# Patient Record
Sex: Male | Born: 1990 | Race: Black or African American | Hispanic: No | Marital: Single | Smoking: Former smoker
Health system: Southern US, Community
[De-identification: ages and names within clinical notes are randomized; demographics above are authoritative.]

## PROBLEM LIST (undated history)

## (undated) DIAGNOSIS — F458 Other somatoform disorders: Secondary | ICD-10-CM

## (undated) DIAGNOSIS — E860 Dehydration: Secondary | ICD-10-CM

## (undated) DIAGNOSIS — R21 Rash and other nonspecific skin eruption: Secondary | ICD-10-CM

## (undated) DIAGNOSIS — L309 Dermatitis, unspecified: Secondary | ICD-10-CM

## (undated) DIAGNOSIS — M25511 Pain in right shoulder: Secondary | ICD-10-CM

## (undated) DIAGNOSIS — J45909 Unspecified asthma, uncomplicated: Secondary | ICD-10-CM

## (undated) DIAGNOSIS — N342 Other urethritis: Secondary | ICD-10-CM

## (undated) DIAGNOSIS — L732 Hidradenitis suppurativa: Secondary | ICD-10-CM

## (undated) DIAGNOSIS — A630 Anogenital (venereal) warts: Secondary | ICD-10-CM

## (undated) DIAGNOSIS — J40 Bronchitis, not specified as acute or chronic: Principal | ICD-10-CM

## (undated) DIAGNOSIS — S42309A Unspecified fracture of shaft of humerus, unspecified arm, initial encounter for closed fracture: Secondary | ICD-10-CM

## (undated) HISTORY — DX: Hidradenitis suppurativa: L73.2

## (undated) HISTORY — DX: Unspecified asthma, uncomplicated: J45.909

## (undated) HISTORY — DX: Other somatoform disorders: F45.8

## (undated) HISTORY — DX: Anogenital (venereal) warts: A63.0

## (undated) HISTORY — DX: Pain in right shoulder: M25.511

## (undated) HISTORY — DX: Unspecified fracture of shaft of humerus, unspecified arm, initial encounter for closed fracture: S42.309A

## (undated) HISTORY — DX: Rash and other nonspecific skin eruption: R21

## (undated) HISTORY — DX: Dehydration: E86.0

## (undated) HISTORY — DX: Other urethritis: N34.2

## (undated) HISTORY — DX: Bronchitis, not specified as acute or chronic: J40

## (undated) HISTORY — DX: Dermatitis, unspecified: L30.9

## (undated) HISTORY — PX: TREATMENT FISTULA ANAL: SUR1390

---
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
## 2009-10-02 ENCOUNTER — Ambulatory Visit: Payer: Self-pay | Admitting: Family Medicine

## 2009-10-02 DIAGNOSIS — N342 Other urethritis: Secondary | ICD-10-CM

## 2009-10-02 HISTORY — DX: Other urethritis: N34.2

## 2009-11-05 ENCOUNTER — Ambulatory Visit: Payer: Self-pay | Admitting: Family Medicine

## 2009-11-05 DIAGNOSIS — F458 Other somatoform disorders: Secondary | ICD-10-CM

## 2009-11-05 HISTORY — DX: Other somatoform disorders: F45.8

## 2009-12-17 ENCOUNTER — Ambulatory Visit: Payer: Self-pay | Admitting: Family Medicine

## 2009-12-21 LAB — CONVERTED CEMR LAB
Basophils Absolute: 0 10*3/uL (ref 0.0–0.1)
Chlamydia, Swab/Urine, PCR: NEGATIVE
Eosinophils Absolute: 0.1 10*3/uL (ref 0.0–0.7)
GC Probe Amp, Urine: NEGATIVE
Lymphs Abs: 2.3 10*3/uL (ref 0.7–4.0)
MCHC: 34.7 g/dL (ref 30.0–36.0)
MCV: 96.7 fL (ref 78.0–100.0)
Monocytes Absolute: 0.3 10*3/uL (ref 0.1–1.0)
Neutrophils Relative %: 39 % — ABNORMAL LOW (ref 43.0–77.0)
Platelets: 152 10*3/uL (ref 150.0–400.0)
RDW: 12.7 % (ref 11.5–14.6)
WBC: 4.5 10*3/uL (ref 4.5–10.5)

## 2010-01-25 ENCOUNTER — Emergency Department (HOSPITAL_COMMUNITY): Admission: EM | Admit: 2010-01-25 | Discharge: 2010-01-26 | Payer: Self-pay | Admitting: Emergency Medicine

## 2010-03-22 ENCOUNTER — Encounter: Payer: Self-pay | Admitting: Allergy and Immunology

## 2010-03-23 ENCOUNTER — Ambulatory Visit
Admission: RE | Admit: 2010-03-23 | Discharge: 2010-03-23 | Payer: Self-pay | Source: Home / Self Care | Attending: Family Medicine | Admitting: Family Medicine

## 2010-03-23 ENCOUNTER — Encounter: Payer: Self-pay | Admitting: Family Medicine

## 2010-03-23 DIAGNOSIS — L732 Hidradenitis suppurativa: Secondary | ICD-10-CM | POA: Insufficient documentation

## 2010-03-23 HISTORY — DX: Hidradenitis suppurativa: L73.2

## 2010-03-31 ENCOUNTER — Telehealth (INDEPENDENT_AMBULATORY_CARE_PROVIDER_SITE_OTHER): Payer: Self-pay | Admitting: *Deleted

## 2010-04-01 NOTE — Assessment & Plan Note (Signed)
Summary: has pimple on buttocks/dt   Vital Signs:  Patient profile:   20 year old male Height:      76 inches (193.04 cm) Weight:      212 pounds (96.36 kg) O2 Sat:      94 % on Room air Temp:     97.9 degrees F (36.61 degrees C) oral Pulse rate:   77 / minute BP sitting:   122 / 80  (right arm) Cuff size:   large  Vitals Entered By: Josph Macho RMA (March 23, 2010 3:18 PM)  O2 Flow:  Room air CC: Pimple on buttocks/ buttcrack/ CF Is Patient Diabetic? No   History of Present Illness: patient is a 20 year old African American male who is in today for evaluation of her recurrent follicular lesion on his gluteus maximus. He reports having trouble with this lesion recurring since November. He says it is hot, red and tender with an elevated center it then drains some pus and then improves only to recur again some weeks later in the same spot. He denies fevers, chills, malaise, myalgias, nausea, anorexia or signs of systemic illness. He reports this particular lesion is actually less uncomfortable today as he did open and drain pus already. He also notes persistent urethral discharge but he denies any color any odor, pain, burning or lesions associated with it he's had it evaluated multiple times and is always been negative over the last 6 months for any infection. He says the discharge is always the same intensity happened after a shower after a bowel movement or randomly. He is denying any other concerns today no shortness of breath, chest pain, palpitations, congestion, headache, GU complaints  Current Medications (verified): 1)  None  Allergies (verified): No Known Drug Allergies  Past History:  Past medical history reviewed for relevance to current acute and chronic problems. Social history (including risk factors) reviewed for relevance to current acute and chronic problems.  Social History: Reviewed history from 10/02/2009 and no changes required. 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 Single Current Smoker Alcohol use-yes, occasional Lives with parents  Review of Systems      See HPI  Physical Exam  General:  Well-developed,well-nourished,in no acute distress; alert,appropriate and cooperative throughout examination Head:  Normocephalic and atraumatic without obvious abnormalities. No apparent alopecia or balding. Mouth:  Oral mucosa and oropharynx without lesions or exudates.  Teeth in good repair. Lungs:  Normal respiratory effort, chest expands symmetrically. Lungs are clear to auscultation, no crackles or wheezes. Heart:  Normal rate and regular rhythm. S1 and S2 normal without gallop, murmur, click, rub or other extra sounds. Abdomen:  Bowel sounds positive,abdomen soft and non-tender without masses, organomegaly or hernias noted. Genitalia:  Testes bilaterally descended without nodularity, tenderness or masses. No scrotal masses or lesions. No penis lesions. He demonstrats some cloudy, scant fluid with manipulation Extremities:  No clubbing, cyanosis, edema, or deformity noted with normal full range of motion of all joints.   Skin:  1-2 cm slightly raised, erythematous lesion in gluteal cleft on right side, smallopening noted in center, very minimally tender Psych:  flat affect.     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 If it recurs he may require referral for surgical removal  Problem #  2:  OTHER URETHRITIS (ICD-597.89)  Orders: T-Chlamydia & GC Probe, Genital ([id number]) Patient reassured that discharge appears physiologic and unless he develops any further symptoms this a  normal variant  Complete Medication List: 1)  Bactrim Ds 800-160 Mg Tabs (Sulfamethoxazole-trimethoprim) .Marland Kitchen..  1 tab by mouth two times a day x 10 day 2)  Betasept Surgical Scrub 4 % Liqd (Chlorhexidine gluconate) .... Wash affected area daily  Patient Instructions: 1)  Please schedule a follow-up appointment as needed if symptoms worsen, if follicular lesion does not resolve then may need referral to surgery for further evaluation  2)  Take your antibiotic as prescribed until ALL of it is gone, but stop if you develop a rash or swelling and contact our office as soon as possible. Eat a yogurt daily while taking antibiotics Prescriptions: BETASEPT SURGICAL SCRUB 4 % LIQD (CHLORHEXIDINE GLUCONATE) wash affected area daily  #4 oz x 1   Entered and Authorized by:   Danise Edge MD   Signed by:   Danise Edge MD on 03/23/2010   Method used:   Print then Give to Patient   RxID:   [id number] BACTRIM DS 800-160 MG TABS (SULFAMETHOXAZOLE-TRIMETHOPRIM) 1 tab by mouth two times a day x 10 day  #20 x 0   Entered and Authorized by:   Danise Edge MD   Signed by:   Danise Edge MD on 03/23/2010   Method used:   Print then Give to Patient   RxID:   [id number]    Orders Added: 1)  T-Chlamydia & GC Probe, Genital [[id number]] 2)  Est. Patient Level III [84696]

## 2010-04-01 NOTE — Assessment & Plan Note (Signed)
Summary: fup//ccm   Vital Signs:  Patient profile:   20 year old male Height:      76 inches (193.04 cm) Weight:      220 pounds (100.00 kg) O2 Sat:      98 % on Room air Temp:     98.3 degrees F (36.83 degrees C) oral Pulse rate:   75 / minute BP sitting:   138 / 84  (left arm) Cuff size:   regular  Vitals Entered By: Josph Macho RMA (November 05, 2009 11:04 AM)  O2 Flow:  Room air CC: Follow-up visit/ CF Is Patient Diabetic? No   History of Present Illness: Patient is a 20 year old American male who is in with complaints of urethritis. He just had trouble with gonococcal and nongonococcal recurrent urethra this in the past. Yesterday he found out that her partner has chlamydia. He acknowledges not using condoms. He has had some urethral discharge. He denies any burning any odor to the discharge any abdominal pain fevers chills or back pain. No other lesions are noted no recent viral or flulike illness noted no chest pain palpitations. His mood appears depressed at this visit he makes poor eye contact is anxious and ultimately says that he does not care about his health or his future. He denies being suicidal or homicidal at does not choose to take steps to improve his situation or his state of mind.  Current Medications (verified): 1)  Metronidazole 500 Mg Tabs (Metronidazole) .Marland Kitchen.. 1 Tab By Mouth Three Times A Day X 7days 2)  Fluconazole 150 Mg Tabs (Fluconazole) .Marland Kitchen.. 1 Tab By Mouth Q Wk X 2 Weeks  Allergies (verified): No Known Drug Allergies  Past History:  Past medical history reviewed for relevance to current acute and chronic problems. Social history (including risk factors) reviewed for relevance to current acute and chronic problems.  Social History: Reviewed history from 10/02/2009 and no changes required. 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 Single Current Smoker Alcohol use-yes, occasional Lives with  parents  Review of Systems      See HPI  Physical Exam  General:  Well-developed,well-nourished,in no acute distress; alert,appropriate and cooperative throughout examination Head:  Normocephalic and atraumatic without obvious abnormalities. No apparent alopecia or balding. Mouth:  Oral mucosa and oropharynx without lesions or exudates.  Teeth in good repair. Neck:  No deformities, masses, or tenderness noted. Lungs:  Normal respiratory effort, chest expands symmetrically. Lungs are clear to auscultation, no crackles or wheezes. Heart:  Normal rate and regular rhythm. S1 and S2 normal without gallop, murmur, click, rub or other extra sounds. Abdomen:  Bowel sounds positive,abdomen soft and non-tender without masses, organomegaly or hernias noted. Extremities:  No clubbing, cyanosis, edema, or deformity noted  Psych:  withdrawn, poor eye contact, and agitated.     Impression & Recommendations:  Problem # 1:  OTHER URETHRITIS (ICD-597.89) Treated empirically with Doxycycline and Amoxicillin today if his urethritis does not resolve he will return for further evaluation and he is asked to use protection when sexually active. He will return in roughly 6 weeks for CBC, HIV and RPR testing  Problem # 2:  ANHEDONIA (ICD-300.89) He is offered referral for counselling and/or medications and declines  Complete Medication List: 1)  Metronidazole 500 Mg Tabs (Metronidazole) .Marland Kitchen.. 1 tab by mouth three times a day x 7days 2)  Fluconazole 150 Mg Tabs (Fluconazole) .Marland Kitchen.. 1 tab by mouth q wk x 2 weeks 3)  Doxycycline  Hyclate 100 Mg Caps (Doxycycline hyclate) .Marland Kitchen.. 1 cap by mouth two times a day x 7days 4)  Amoxicillin 500 Mg Caps (Amoxicillin) .Marland Kitchen.. 1 cap by mouth three times a day 7 day  Patient Instructions: 1)  Please schedule a follow-up appointment as needed if symptoms worsen or do not improve 2)  Needs RPR, HIV and CBC in 6 weeks for 597.80 Prescriptions: AMOXICILLIN 500 MG CAPS (AMOXICILLIN) 1  cap by mouth three times a day 7 day  #21 x 0   Entered and Authorized by:   Danise Edge MD   Signed by:   Danise Edge MD on 11/05/2009   Method used:   Print then Give to Patient   RxID:   [id number] DOXYCYCLINE HYCLATE 100 MG CAPS (DOXYCYCLINE HYCLATE) 1 cap by mouth two times a day x 7days  #14 x 0   Entered and Authorized by:   Danise Edge MD   Signed by:   Danise Edge MD on 11/05/2009   Method used:   Print then Give to Patient   RxID:   [id number]

## 2010-04-01 NOTE — Assessment & Plan Note (Signed)
Summary: NGU // RS   Vital Signs:  Patient profile:   20 year old male Height:      76 inches (193.04 cm) Weight:      218 pounds (99.09 kg) BMI:     26.63 O2 Sat:      96 % on Room air Temp:     98.2 degrees F (36.78 degrees C) oral Pulse rate:   64 / minute BP sitting:   122 / 82  (left arm) Cuff size:   regular  Vitals Entered By: Josph Macho RMA (October 02, 2009 3:07 PM)  O2 Flow:  Room air CC: Nongonococcal Urethritis- was treated in Nov, March, May, June, and July/ pt went to [facility] this [facility] and they informed pt he didn't have this again but pt states he has discharge like the other times/ CF   History of Present Illness: Patient in today for evaluation of recurrent NGU. He has been presenting to the health department off and on for months with recurrence of penile discharge. He describes it as clear and non painful. They have treated him empirically with by mouth and Im antibiotics but all of his testing has come back neg for GC and Chlamydia. No fevers/chills/dysuria/lesions/anorexia/abdominal or back pain. He is concerned because it always seems to happen after he is sexually active with the same woman. She says she has been tested and is neg. Sometimes he reports he even uses condoms and he still gets discharge. 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 Single Current Smoker Alcohol use-yes, occasional Lives with parents Occupation:   employed Smoking Status:  current  Review of Systems  The patient denies anorexia, fever, weight loss, weight gain, vision loss, decreased hearing, hoarseness, chest pain, syncope, dyspnea on exertion, peripheral edema, prolonged cough, headaches, hemoptysis, abdominal pain, melena, hematochezia, severe indigestion/heartburn, hematuria, incontinence, genital sores, muscle weakness, suspicious skin lesions, transient blindness, difficulty walking, depression, unusual weight change, enlarged lymph nodes, and testicular masses.    Physical Exam  General:  Well-developed,well-nourished,in no acute distress; alert,appropriate and cooperative throughout examination Head:  Normocephalic and atraumatic without obvious abnormalities. No apparent alopecia or balding. Eyes:  No corneal or conjunctival inflammation noted. EOMI. Perrla.  Ears:  External ear exam shows no significant lesions or deformities.  Otoscopic examination reveals clear canals, tympanic membranes are intact bilaterally without bulging, retraction, inflammation or discharge. Hearing is grossly normal bilaterally. Nose:  External nasal examination shows no deformity or inflammation. Nasal mucosa are pink and moist without lesions or exudates. Mouth:  Oral mucosa and oropharynx without lesions or exudates.  Teeth in good repair. Neck:  No deformities, masses, or tenderness noted. Lungs:  Normal respiratory effort, chest expands symmetrically. Lungs are clear to auscultation, no crackles or wheezes. Heart:  Normal rate and regular rhythm. S1 and S2 normal without gallop, murmur, click, rub or other extra sounds. Abdomen:  Bowel sounds positive,abdomen soft and non-tender without masses, organomegaly or hernias noted. Msk:  No deformity or scoliosis noted of  thoracic or lumbar spine.   Pulses:  R and L carotid,radial,femoral,dorsalis pedis and posterior tibial pulses are full and equal bilaterally Extremities:  No clubbing, cyanosis,  edema, or deformity noted with normal full range of motion of all joints.   Neurologic:  No cranial nerve deficits noted. Station and gait are normal. Plantar reflexes are down-going bilaterally. DTRs are symmetrical throughout. Sensory, motor and coordinative functions appear intact. Skin:  Intact without suspicious lesions or rashes Cervical Nodes:  No lymphadenopathy noted Inguinal Nodes:  No significant adenopathy Psych:  Cognition and judgment appear intact. Alert and cooperative with normal attention span and concentration. No apparent delusions, illusions, hallucinations   Impression & Recommendations:  Problem # 1:  OTHER URETHRITIS (ICD-597.89) Avoid perfumes and harsh soaps, start a probiotic. Treat with Flagyl and Diflucan simultaeously and use condoms with all sexual activity. Report persistent symptoms.  Problem # 2:  Preventive Health Care (ICD-V70.0) Check fasting labs prior to next visit  Complete Medication List: 1)  Metronidazole 500 Mg Tabs (Metronidazole) .Marland Kitchen.. 1 tab by mouth three times a day x 7days 2)  Fluconazole 150 Mg Tabs (Fluconazole) .Marland Kitchen.. 1 tab by mouth q wk x 2 weeks  Patient Instructions: 1)  Please schedule a follow-up appointment in 1 month.  2)  Please schedule a follow-up appointment as needed if symptoms worsen or do not resolve.  3)  Use a mild soap without any perfumes. 4)  Use cotton undergarments 5)  Take meds as directed  6)  If you could be exposed to sexually transmitted diseases. you should use a condom.  Prescriptions: FLUCONAZOLE 150 MG TABS (FLUCONAZOLE) 1 tab by mouth q wk x 2 weeks  #2 x 0   Entered and Authorized by:   Danise Edge MD   Signed by:   Danise Edge MD on 10/02/2009   Method used:   Print then Give to Patient   RxID:   [id number] METRONIDAZOLE 500 MG TABS (METRONIDAZOLE) 1 tab by mouth three times a day x 7days  #21 x 0   Entered and Authorized by:   Danise Edge MD   Signed by:   Danise Edge MD on 10/02/2009    Method used:   Print then Give to Patient   RxID:   [id number]

## 2010-04-07 NOTE — Progress Notes (Signed)
Summary: Chlamydia and Gonorrhea tests  Phone Note Outgoing Call   Summary of Call: Left a message for pt to return my call. Pt needs to be informed that Chlamydia and Gonorrhea testing came back negative.  Initial call taken by: Josph Macho RMA,  March 31, 2010 9:54 AM  Follow-up for Phone Call        Pt informed Follow-up by: Josph Macho RMA,  March 31, 2010 2:35 PM

## 2010-04-08 ENCOUNTER — Encounter: Payer: Self-pay | Admitting: Family Medicine

## 2010-06-04 ENCOUNTER — Ambulatory Visit: Payer: Self-pay | Admitting: Family Medicine

## 2010-06-22 ENCOUNTER — Other Ambulatory Visit: Payer: Self-pay | Admitting: Family Medicine

## 2010-06-29 ENCOUNTER — Ambulatory Visit (INDEPENDENT_AMBULATORY_CARE_PROVIDER_SITE_OTHER): Payer: Self-pay | Admitting: Family Medicine

## 2010-06-29 ENCOUNTER — Encounter: Payer: Self-pay | Admitting: Family Medicine

## 2010-06-29 VITALS — BP 134/83 | HR 69 | Temp 98.1°F | Ht 76.0 in | Wt 220.1 lb

## 2010-06-29 DIAGNOSIS — L732 Hidradenitis suppurativa: Secondary | ICD-10-CM

## 2010-06-29 DIAGNOSIS — N342 Other urethritis: Secondary | ICD-10-CM

## 2010-06-29 DIAGNOSIS — A630 Anogenital (venereal) warts: Secondary | ICD-10-CM

## 2010-06-29 DIAGNOSIS — F458 Other somatoform disorders: Secondary | ICD-10-CM

## 2010-06-29 HISTORY — DX: Anogenital (venereal) warts: A63.0

## 2010-06-29 MED ORDER — IMIQUIMOD 5 % EX CREA: TOPICAL_CREAM | CUTANEOUS | Status: DC

## 2010-06-29 NOTE — Assessment & Plan Note (Signed)
Grayish discharge on exam today, no obvious inflammation or irritation at meatus, check GC/Chlamydia before treating. Agrees to HIV and RPR testing today.

## 2010-06-29 NOTE — Assessment & Plan Note (Addendum)
Does have scattered lesions at base of glans. Will start Aldara cream to affected area 3 week and call if no improvement. Once again importance of regular condom is reaffirmed. Patient still not using them at all and noncommital about use

## 2010-06-29 NOTE — Patient Instructions (Addendum)
Urethritis, Adult Urethritis is an inflammation (soreness) of the urethra (the tube exiting from the bladder). It is often caused by germs that may be spread through sexual contact. TREATMENT Urethritis will usually respond to antibiotics. These are medications that kill germs. Take all the medicine given to you. You may feel better in a couple days, but TAKE ALL MEDICINE or the infection may not be completely cured and may become more difficult to treat. Response can generally be expected in 7 to 10 days. You may require additional treatment after more testing. IT IS VERY IMPORTANT THAT YOU  Not have sex until the test results are known and treatment is completed.   Know that you may be asked to notify your sex partner when your final test results are back.   Finish all medications as prescribed.   Prevent sexually transmitted infections including AIDS. Practice safe sex. Use condoms.  SEEK MEDICAL CARE IF:  Your symptoms are not improved in 2 to 3 days.   Your symptoms are getting worse.   Your develop abdominal (belly) pain.   You develop joint pain.   You have an oral temperature above 102 F (38.9 C).  SEEK IMMEDIATE MEDICAL CARE IF:  You have an oral temperature above 102 F (38.9 C), not controlled by medicine.   You develop severe pain in the belly, back or side.   You develop repeated vomiting.  TEST RESULTS Not all test results are available during your visit. If your test results are not back during the visit, make an appointment with your caregiver to find out the results. Do not assume everything is normal if you have not heard from your caregiver or the medical facility. It is important for you to follow-up on all of your test results. Document Released: 08/10/2000 Document Re-Released: 03/08/2009 [facility] Patient Information 2011 [place], [place].Genital Warts Genital warts are caused by a virus called Human Papilloma Virus (HPV). HPV is the most common sexually  transmitted disease, STD, and infection of the sex organs. There are about 100 different types of the HPV's and 4 of these can cause cancer of the cervix. Ninety percent of genital warts are caused by the HPV.  CAUSES This infection is spread by unprotected sex with an infected person. It can be spread by vaginal, anal, and oral sex. Many people will not know they are infected. They may be infected for years with little or no problems (symptoms). They can still unknowingly pass the infection to sexual partners. The most serious problem is that certain types of HPV is associated with cervical cancer but not genital warts. You are more likely to have HPV if you have other sexually transmitted disease. SYMPTOMS  Itching and irritation in the genital area.   Warts that bleed.   Painful sexual intercourse due to warts.  DIAGNOSIS  Warts are usually recognized with the naked eye in the vagina, on the vulva, the perineum, the anus and in the rectum.   With a Pap test.   With a biopsy.   With colposcopy (an instrument that magnifies the HPV cells that change color with certain solutions).  TREATMENT Warts can be removed by applying certain chemicals to the warts such as:  Podophllin.   Bichloroacedic acid.   Trichloroacedic acid.  Other treatments include:  Interferon injections.   Freezing (cryotherapy).   Laser treatment.   Removal of warts by burning them with electrified probe (electrocautery).   Surgery.  PREVENTION HPV vaccination can help prevent HPV infections  that cause genital warts and that cause cancer of the cervix. It is recommended that the vaccination be given to people between the ages 43 to 83 years old. The vaccine might not work as well or might not work at all if you already have HPV. It should not be given to pregnant women. HOME CARE INSTRUCTIONS  It is important to follow your caregiver's instructions. The warts will not go away without treatment. Repeat  treatments often are needed to get rid of the warts. Even after it appears that the warts are gone, the normal tissue underneath often remains infected.   DO NOT try to treat genital warts with medicine used to treat hand warts. This type of medicine is strong and can burn the skin in the genital area, causing more damage.   WARNING: This infection is contagious. Tell your sexual partner(s) with whom you had sex before treatment that you have genital warts. They may be infected also and need treatment.   WARNING: Avoid sexual contact while being treated. Following treatment the use of condoms will help prevent reinfection. This virus is hard to wipe out. You may always be contagious.   DO NOT touch or scratch the warts. This is a viral infection and may spread to other parts of your body (auto-inoculation).   Women with genital warts should have a cervical cancer check (pap smear) at least once a year. This type of cancer is slow-growing and can be cured if found early. Chances of developing cervical cancer are increased with HPV.   Inform your obstetrician in the event of pregnancy. This virus can be passed to the baby's respiratory tract. Discuss this with your caregiver.   Use a condom during sexual intercourse.   There are over-the-counter anti-itch creams you can use for itching, ask your caregiver before using it.   Ask your caregiver about HPV vaccination.   Have only one sex partner. Make sure your partner has only one sex partner.  SEEK MEDICAL CARE IF:  The treated skin becomes red, swollen, or painful.   An oral temperature above 104 or higher develops.   You feel generally ill.   You feel little lumps (pimple-like) in and around your genital area.   You are bleeding or have painful sexual intercourse.  MAKE SURE YOU:   Understand these instructions.   Will watch your condition.   Will get help right away if you are not doing well or get worse.  Document Released:  02/12/2000 Document Re-Released: 08/04/2009 [facility] Patient Information 2011 [place], [place].

## 2010-06-29 NOTE — Progress Notes (Signed)
Travis Burns [id number] January 30, 1991 06/29/2010      Progress Note-Follow Up  Subjective  Chief Complaint  Chief Complaint  Patient presents with  . Penile Discharge    and bumps (not sore) x 3 days    HPI  Patient is a 20 year old [place] American male in today with recurrent urethritis. He continues to be frequently sexually active with multiple partners and does not use condoms. He reports his discharge improved at his last visit. He was given a course of Bactrim for hidradenitis and he feels that helped his discharge. His hidradenitis also improved although he did recently have a flare requiring him to use his Hibiclens again which has now solved that problem. Unfortunately about 1-2 months ago he began to notice changes increase urethral discharge again. He denies color, odor, discomfort, dysuria, abdominal pain, back pain, fevers, chills, GI or GU complaints. He's not had any recent illness, chest pain or palpitations, shortness of breath. He does continue to show with anhedonia and fatigue but denies suicidal ideation. He is changing any products, soaps. Is complaining of some new lesions of raised papular, nontender on the glands and on the shaft for roughly 3 weeks now. They are unchanged since the appeared.  Past Medical History  Diagnosis Date  . Broken arm     left  . Genital warts 06/29/2010  . Other urethritis 10/02/2009  . HIDRADENITIS SUPPURATIVA 03/23/2010  . ANHEDONIA 11/05/2009    History reviewed. No pertinent past surgical history.  Family History  Problem Relation Age of Onset  . Cancer Maternal Grandfather     lung/ Smoker  . Alcohol abuse Paternal Grandmother     History   Social History  . Marital Status: Single    Spouse Name: N/A    Number of Children: N/A  . Years of Education: N/A   Occupational History  . Not on file.   Social History Main Topics  . Smoking status: Current Some Day Smoker    Types: Cigarettes  . Smokeless tobacco: Never Used  .  Alcohol Use: 0.0 oz/week    0 drink(s) per week     occasional  . Drug Use: Yes    Special: Marijuana     marijuana  . Sexually Active: Yes -- Male partner(s)   Other Topics Concern  . Not on file   Social History Narrative  . No narrative on file    Current Outpatient Prescriptions on File Prior to Visit  Medication Sig Dispense Refill  . HIBICLENS 4 % external liquid USE AS DIRECTED  236 mL  1  . DISCONTD: sulfamethoxazole-trimethoprim (BACTRIM DS) 800-160 MG per tablet Take 1 tablet by mouth 2 (two) times daily. X 10 days         No Known Allergies  Review of Systems  Review of Systems  Constitutional: Negative for fever and malaise/fatigue.  HENT: Negative for congestion.   Eyes: Negative for discharge.  Respiratory: Negative for shortness of breath.   Cardiovascular: Negative for chest pain, palpitations and leg swelling.  Gastrointestinal: Negative for nausea, abdominal pain and diarrhea.  Genitourinary: Negative for dysuria, urgency, frequency, hematuria and flank pain.       Notes a 1-2 month history of increased urethral discharge again, denies any pain or burning, no odor or color. Describes discharge as constant. Also notes some new asymptomatic bumps on his glans and shaft over last several weeks. Not enlarging, bleeding, itching or painful  Musculoskeletal: Negative for falls.  Skin: Negative for rash.  Neurological: Negative for loss of consciousness and headaches.  Endo/Heme/Allergies: Negative for polydipsia.  Psychiatric/Behavioral: Negative for depression and suicidal ideas. The patient is not nervous/anxious and does not have insomnia.     Objective  BP 134/83  Pulse 69  Temp(Src) 98.1 F (36.7 C) (Oral)  Ht 6\' 4"  (1.93 m)  Wt 220 lb 1.9 oz (99.846 kg)  BMI 26.79 kg/m2  SpO2 97%  Physical Exam  Physical Exam  Constitutional: He appears well-developed and well-nourished. No distress.  HENT:  Head: Normocephalic and atraumatic.  Eyes: No  scleral icterus.  Neck: Normal range of motion.  Cardiovascular: Normal rate, regular rhythm and normal heart sounds.   No murmur heard. Pulmonary/Chest: Effort normal and breath sounds normal. No respiratory distress.  Abdominal: Soft. Bowel sounds are normal. He exhibits no distension and no mass. There is no rebound.  Genitourinary: No penile tenderness.       Grayish discharge, small papular lesions one on glans and several at base of glans on shaft, mildly erythematous no inflammation. B/l inguinal LN palpated, Nontender roughly 1 cm in diameter b/l  Psychiatric:       Very flat affect    No results found for this basename: TSH   Lab Results  Component Value Date   WBC 4.5 12/17/2009   HGB 14.6 12/17/2009   HCT 41.9 12/17/2009   MCV 96.7 12/17/2009   PLT 152.0 12/17/2009   No results found for this basename: CREATININE, BUN, NA, K, CL, CO2   No results found for this basename: ALT, AST, GGT, ALKPHOS, BILITOT   No results found for this basename: CHOL   No results found for this basename: HDL   No results found for this basename: LDLCALC   No results found for this basename: TRIG   No results found for this basename: CHOLHDL     Assessment & Plan  OTHER URETHRITIS Grayish discharge on exam today, no obvious inflammation or irritation at meatus, check GC/Chlamydia before treating. Agrees to HIV and RPR testing today.   HIDRADENITIS SUPPURATIVA Had a recent flare but used the Hibicleans and it improved.  Genital warts Does have scattered lesions at base of glans. Will start Aldara cream to affected area 3 week and call if no improvement. Once again importance of regular condom is reaffirmed. Patient still not using them at all and noncommital about use  ANHEDONIA Patient still with very flat affect but no c/o depression today, will have him return in 2 months to reassess

## 2010-06-29 NOTE — Assessment & Plan Note (Signed)
Patient still with very flat affect but no c/o depression today, will have him return in 2 months to reassess

## 2010-06-29 NOTE — Assessment & Plan Note (Signed)
Had a recent flare but used the Hibicleans and it improved.

## 2010-06-30 LAB — RPR

## 2010-06-30 MED ORDER — DOXYCYCLINE HYCLATE 100 MG PO CAPS: 100.0000 mg | ORAL_CAPSULE | Freq: Two times a day (BID) | ORAL | Status: DC

## 2010-06-30 NOTE — Progress Notes (Signed)
Addended by: Josph Macho on: 06/30/2010 08:54 AM   Modules accepted: Orders

## 2010-07-01 ENCOUNTER — Telehealth: Payer: Self-pay | Admitting: Family Medicine

## 2010-07-01 NOTE — Telephone Encounter (Signed)
So these lesions are transmittable, they are much less likely to pass from intact skin to intact skin than they would be through mucus membranes but it is not impossible so other people should avoid them. He should continue the Aldara cream as long as he does not have any trouble with it it works well. This is not life threatening and he can attempt to have kids when he is ready to do so but would certainly recommend condom use routinely until then as previously recommended.

## 2010-07-01 NOTE — Telephone Encounter (Signed)
Pt informed

## 2010-07-01 NOTE — Telephone Encounter (Signed)
Left a message for pt to return my call. 

## 2010-07-01 NOTE — Telephone Encounter (Signed)
Patient would like to know if his genital warts will go away with the cream or does he have to have them frozen off? Can his sexual partner get the warts on their hand?

## 2010-07-01 NOTE — Progress Notes (Signed)
Addended by: Danise Edge on: 07/01/2010 08:27 AM   Modules accepted: Orders

## 2010-07-13 NOTE — Consult Note (Signed)
NAME:  Travis Burns, Travis Burns NO.:  [id number]   MEDICAL RECORD NO.:  [id number]          PATIENT TYPE:  INP   LOCATION:  3032                         FACILITY:  [facility]   PHYSICIAN:  Anselm Jungling, MD  DATE OF BIRTH:  05-16-1990   DATE OF CONSULTATION:  05/30/2007  DATE OF DISCHARGE:                                 CONSULTATION   IDENTIFYING DATA AND REASON FOR REFERRAL:  The patient is an 20-year-  old, African-American male who lives with his parents and attends  school.  He is admitted in the aftermath of a drug ingestion.  Psychiatric consultation is requested to assess mental status and make  recommendations.   HISTORY OF THE PRESENTING PROBLEMS:  The patient has no psychiatric  history that I am aware of.  He has indicated that he and his friends  were ingesting drugs to get high, and following this, he apparently  went into a state of confusion, delirium, and psychosis.  During this  time, he experienced demons.   The patient has also stating that he has had brief intermittent periods  of alterations of consciousness associated with shaking, he estimates  about four times, that he states have not necessarily been associated  with drug abuse.   Because of concern that this recent ingestion may have represented a  suicide attempt, he has been placed with a one-to-one sitter.   Yesterday, May 29, 2007, I came to see the patient, but he was  somnolent and unable to be interviewed.  Today, he has been medically  cleared, and I was able to examine him and discuss his situation with  him at length.   MENTAL STATUS EXAM AND OBSERVATIONS:  The patient is a well-nourished,  normally-developed, and athletically built, young African-American male  with long hair that is done up in a do-rag.  He is sitting up in bed.  He is awake, alert, and fully oriented.  He seems to have a very good  grasp of why he was hospitalized.   His general style and manner is very much  in keeping with street rap.  He uses a great deal of slang, which makes it somewhat difficult to  understand everything that he is saying.  His sentences are often  punctuated with you see what when I am saying.  Aside from this, his  thoughts and speech appear to be normally organized without any clear  association disorder, or delusionality.  He does tell me about the fact  that he felt that there was a demon inside him.  He states that this  happened while he was high, but he also states that it has happened when  he was not high.  He seems to feel that this was a real experience and  not drug-induced.   There is some indication that this drug ingestion may have in part been  related to suicidal thoughts.  He gave a lengthy explanation about how  he and his friends were talking about life and death, and the ability to  come back from death, and he thought it  would be interesting to find a  quick way to die to see if he could come back.  Aside from this, he  denies any actual intent to have ended his life in any permanent sense.  He denies any suicidal ideation now.   He does not appear to be responding to auditory or visual stimuli.  He  denies auditory and visual hallucinations at this time, and does not  seem to feel he is possessed.   He indicates that he very much wants to go home with his parents today.   When we discussed his drug use, I asked him if he plans to abstain from  drugs any further, given this bad experience.  He stated for now.   I discussed with him the possibility of any kind of help or counseling  that we might offer him, and he basically laughed this off.   IMPRESSION:  This is a young man who appears to be very caught up in the  street culture, involving rap music and drug taking.  He apparently is  open to taking a wide range of drugs that are available to him.  It is  not clear what he took that led to this episode of hospitalization, but  there is a  good chance that it could have included not only such common  drugs as marijuana or cocaine, but also PCP or other hallucinogen such  as LSD or ecstasy.  My hope is that the quasi-psychotic experience he  has had with demons is solely a drug-induced one.  However, I cannot  rule out the possibility that there is an evolving schizophreniform  disorder accounting for these experiences.  Also, there is a possibility  that he could have an underlying schizophreniform disorder, being  exacerbated and brought to a head by hallucinogenic drug use.  I know of  no family history of schizophrenia or bipolar disorder.   I discussed this with the patient's parents and indicated to them that I  feel that he may need drug treatment, and that he may also need mental  health intervention, if he persists in having any further talk or  experiences around demons.  The parents agreed with this.  I gave them  the 24-hour crisis assessment line number for the [facility].   DIAGNOSTIC IMPRESSION:  AXIS I:  Status post toxic psychosis/delirium.  Polysubstance abuse.  Rule out psychotic disorder, not otherwise  specified.  Rule out schizophreniform disorder.  AXIS II:  Deferred.  AXIS III:  Rule out seizure disorder.  AXIS IV:  Stressors severe.  AXIS V:  GAF 60.   RECOMMENDATIONS:  As above.  In addition, I spoke with Dr. Ashley Royalty  about the advisability of getting an EEG to rule out the possibility of  underlying seizure disorder, and also the possibility of a CT scan of  the head to review along side the EEG.  I believe that she will be  making arrangements for this to be done either before he is discharged,  or on an outpatient basis shortly afterwards.   I do not recommend any psychotropic medications at this time.  I think  it best to allow his system to be cleared of any and all mind and mood  altering substances for now.   My understanding is that he will be discharged, and he will  go home to  his parents.  His parents of talked of the large number of support he  has  including friends and various teachers who have come to visit him  while he has been in the hospital.   Thank you for involving me in this patient's care.     Anselm Jungling, MD  Electronically Signed     Anselm Jungling, MD  Electronically Signed   SPB/MEDQ  D:  05/30/2007  T:  05/30/2007  Job:  [id number]

## 2010-07-13 NOTE — Discharge Summary (Signed)
NAME:  Travis Burns, Travis Burns NO.:  [id number]   MEDICAL RECORD NO.:  [id number]          PATIENT TYPE:  INP   LOCATION:  3032                         FACILITY:  [facility]   PHYSICIAN:  Altha Harm, MDDATE OF BIRTH:  24-Aug-1990   DATE OF ADMISSION:  05/28/2007  DATE OF DISCHARGE:  05/30/2007                               DISCHARGE SUMMARY   DISCHARGE DISPOSITION:  Home.   FINAL DISCHARGE DIAGNOSES:  1. Toxic psychosis/delirium, resolving (cannot rule out evolving      schizophrenic disorder).  2. Ingestion of Coricidin cough and cold for recreational purposes,      symptoms resolved.  3. Pyuria being treated as a urinary tract infection.  4. Recreational drug use including, marijuana, NyQuil and Thorazine.   DISCHARGE MEDICATIONS:  Ciprofloxacin 500 mg p.o. b.i.d. x4 days.   CONSULTANTS:  Dr. Electa Sniff, psychiatry.   DIAGNOSTIC STUDIES:  1. EEG done on 05/30/07, results pending, to be followed up by Dr. Irena Cords, primary care physician.  2. CT head without contrast ordered; however, the patient's family do      not want to wait to have it done as an inpatient.  Recommended it      be done as an outpatient.   PRIMARY CARE PHYSICIAN:  Dr. Irena Cords, pediatrics.   ALLERGIES:  NO KNOWN DRUG ALLERGIES.   CODE STATUS:  Full code.   CHIEF COMPLAINT:  Overdose.   HISTORY OF PRESENT ILLNESS:  Please see the H&P dictated by Dr. Della Goo on May 28, 2007, for details of the HPI.  However, in brief,  this is a 20 year old gentleman with out with his friends partying.  The  patient states that he had what he describes as a psychotic episode once  his friends started speaking about religious subject.  The patient had  taken Coricidin, approximately 15 tablets prior to coming to the  hospital.  When the patient left the hospital, he was obtunded and  unable to contribute any information.  The patient did state that he has  had several episodes in the  past where he has had this psychotic  behavior, some of them when he has not been high.   HOSPITAL COURSE:  1. The patient was placed on a monitor and observed initially.  He was      given IV fluids for volume support.  The patient resolved his      obtundation and able to speak with the psychiatrist.  When the      patient awoke, he indicated to Dr. Roxan Hockey that he wanted to kill      himself; however, subsequently the patient changed his story and      stated that there was something within him that was forcing him to      run into the traffic.  He states that he has had several episodes      in the past where he has had bizarre behaviors, but does not      remember the behavior.  He states that he has been told by  the      people of his behavior in these instances.  In light of this, the      patient did obtain an EEG to rule out any seizure activity that may      be causing him to have loss of time.  The patient was also seen by      psychiatry, who felt that this could be an emerging schizophrenic      disorder.  A CT scan of the head was recommended; however, the      patient and his family wanted to be discharged and have the CT done      as outpatient, thus we recommend the patient had a CT scan done as      an outpatient and follow-up with primary care physician.  I do not      think that there is an urgency to have the CT done as an inpatient      as the patient shows no focal deficits neurologically at this time.  2. Pyuria.  The patient was found to have white blood cells in his      urine and started empirically by Dr. Lovell Sheehan on ciprofloxacin.      Urine cultures were not obtained, thus the patient will be treated      empirically for a total of 7 days with ciprofloxacin.   Recreational drug use.  The patient has been counseled against any  further drug use.  The patient has stated that he will abstain from  using drugs for now.  However, I do not hold any confidence  that the  patient is in a contemplated state and will permanently refrain from  recreational drug use.   Recommendations for outpatient is the patient receive day treatment with  mental health services for further evaluation of his possibly emerging  psychiatric disorder.  The patient should also follow up with Dr. Irena Cords, his pediatrician in 3-5 days.  In addition, the patient should  probably obtain a CT of the head as an outpatient, and I will leave to  the discretion of his primary care physician to perform.  The patient  currently has no physical or diet restrictions.      Altha Harm, MD  Electronically Signed     MAM/MEDQ  D:  05/30/2007  T:  05/30/2007  Job:  [id number]   cc:   Rosalyn Gess, M.D.

## 2010-07-13 NOTE — H&P (Signed)
NAME:  Travis Burns, Travis Burns NO.:  [id number]   MEDICAL RECORD NO.:  [id number]          PATIENT TYPE:  INP   LOCATION:  2113                         FACILITY:  [facility]   PHYSICIAN:  Della Goo, M.D. DATE OF BIRTH:  Jul 15, 1990   DATE OF ADMISSION:  05/28/2007  DATE OF DISCHARGE:                              HISTORY & PHYSICAL   CONTINUATION:   PHYSICAL EXAMINATION:  HEENT:  Normocephalic, atraumatic.  Pupils are  sluggish but reactive.  Extraocular muscles are intact.  Oropharynx is  clear.  There are no tongue lacerations.  NECK:  Supple.  No jugular venous distention, thyromegaly, adenopathy.  CARDIOVASCULAR:  Mild tachycardiac rate and rhythm.  No murmurs, gallops  or rubs.  LUNGS:  Clear to auscultation bilaterally.  ABDOMEN:  Positive bowel sounds, soft, nontender, nondistended.  EXTREMITIES:  Without cyanosis, clubbing or edema.  NEUROLOGIC:  The patient is obtunded and agitated.   LABORATORY STUDIES:  Hemoglobin 17.3 and hematocrit 51 on the ISTAT  history.  Sodium 141, potassium 3.4, chloride 109, BUN 18, creatinine  1.7, CO2 16 and glucose 174.  Liver function tests with a total  bilirubin of 0.6, AST 33, ALT 13, albumin 4.4, alkaline phosphatase 97.  Alcohol level less than 5.  Acetaminophen level less than 10.  Salicylate level less than 4.  Urine drug screen positive for cannabis,  otherwise negative.  Urinalysis negative except for trace leukocytes.   ASSESSMENT:  An 20 year old male being admitted with  1. Overdose on dextromethorphan/chlorpheniramine/over-the-counter cold      medication.  2. Marijuana usage.  3. Seizure x1.  4. Altered mental status.   PLAN:  The patient will be admitted to an ICU area for cardiac and  pulmonary monitoring.  The patient will be provided supportive therapy  and be made n.p.o. for now and placed on IV fluids for maintenance and  rehydration therapy.  Poison Control was contacted and the  recommendatation was  for supportive therapy and cardiac intensive care  monitoring.  The patient will also be medicated p.r.n. for agitation  with mild dose IV benzodiazepines.  If the patient has compromise of his  airway, the patient will be intubated for support.  At this time, the  patient is maintaining his airway and responding.  His overall condition  has been discussed with his parents who are at bedside.      Della Goo, M.D.  Electronically Signed     HJ/MEDQ  D:  05/28/2007  T:  05/28/2007  Job:  [id number]

## 2010-07-13 NOTE — H&P (Signed)
NAME:  DEKARI, BURES NO.:  [id number]   MEDICAL RECORD NO.:  [id number]          PATIENT TYPE:  INP   LOCATION:  2113                         FACILITY:  [facility]   PHYSICIAN:  Della Goo, M.D. DATE OF BIRTH:  06-25-1990   DATE OF ADMISSION:  05/28/2007  DATE OF DISCHARGE:                              HISTORY & PHYSICAL   CHIEF COMPLAINT:  Overdose.   PRIMARY CARE PHYSICIAN:  Unassigned.   HISTORY OF PRESENT ILLNESS:  This is an 20 year old male who was brought  emergently to the emergency department after a reported overdose and  complaints of altered mental status.  The history was provided by the  patient's mother and father, who are at the emergency department, and  also a friend who was with the patient.  Their report is that earlier in  the evening the patient and his friends ingested over-the-counter cold  medicine tablets, tablets that consist of chlorpheniramine 4-mg tablets  with dextromethorphan 30 mg, and took an estimated 8-10 tablets in an  attempt to get high.  The patient also had ingested marijuana as well.  Per report of the family, the patient has had no previous or prior  experience with illicit drug experimentation or usage.   The patient's mother gives the history and reports that the patient has  been previously healthy has had no medical problems.  Has had a few  minor injuries from playing sports in high school.   He takes no regular medications and has no known drug allergies.   The patient prior to this has had no history of smoking or drinking or  illicit drug usage.   FAMILY HISTORY:  Noncontributory.   PHYSICAL EXAMINATION FINDINGS:  This is an obtunded 20 year old male in  discomfort but in no acute distress currently.  His vital signs initially were blood pressure 140/78, heart rate 124,  respirations 18 and O2 saturations 96% on room air.   Dictation ended at this point.      Della Goo, M.D.  Electronically Signed     HJ/MEDQ  D:  05/28/2007  T:  05/28/2007  Job:  [id number]

## 2010-07-13 NOTE — Procedures (Signed)
EEG NUMBER:  [id number].   ORDERED BY:  Dr. Ashley Royalty.   An 20 year old was admitted on May 28, 2007 for a drug overdose with  Coricidin, marijuana, and dextromethorphan.  He had altered mental  status and seizures, was shaking violently after Narcan administration  in the emergency department.   MEDICATIONS LISTED:  Include:  1. Lovenox.  2. Protonix.  3. Cipro.  4. Zofran.  5. Tylenol.  6. Dilaudid.  7. Ativan.   This was a routine 17-channel EEG with 1 channel devoted to EKG  utilizing the [employer] 10/20 lead placement system.  The patient  was described as being clinically awake and alert.  Electrographically  appeared to be awake and drowsy.  While the patient was clearly awake,  the background consisted of a well-organized, well-developed, well-  modulated 10 Hz alpha activity which is predominant in the posterior  head regions and reactive to eye-opening.  Some decrease in amplitude is  noted diffusely with some beta activity seen.  During drowsiness, there  was decrease in frequency and amplitude as well.  No interhemispheric  asymmetry was identified, and no definite epileptiform discharges were  seen.  Hyperventilation and photic stimulation were both performed but  did not produce any significant change in background activity.  The EKG  monitor revealed relatively regular rhythm with a rate of 78 beats per  minute.   CONCLUSION:  Normal awake and drowsy EEG without seizure activity or  focal abnormality seen during the course of today's recording.  Clinical  correlation is recommended.      Catherine A. Orlin Hilding, M.D.  Electronically Signed     ZOX:WRUE  D:  05/30/2007 17:22:22  T:  05/31/2007 45:40:98  Job #:  [id number]

## 2010-11-22 LAB — POCT I-STAT, CHEM 8
Calcium, Ion: 1.11 — ABNORMAL LOW
Chloride: 109
Glucose, Bld: 174 — ABNORMAL HIGH
HCT: 51
Hemoglobin: 17.3 — ABNORMAL HIGH
TCO2: 16

## 2010-11-22 LAB — CBC
HCT: 38.9 — ABNORMAL LOW
Hemoglobin: 13.5
MCV: 94.6
MCV: 95.9
Platelets: 122 — ABNORMAL LOW
Platelets: 140 — ABNORMAL LOW
RBC: 4.06 — ABNORMAL LOW
RBC: 4.22
WBC: 10.1
WBC: 5.6

## 2010-11-22 LAB — COMPREHENSIVE METABOLIC PANEL
AST: 22
Albumin: 3.4 — ABNORMAL LOW
Alkaline Phosphatase: 72
BUN: 10
CO2: 27
Chloride: 110
GFR calc non Af Amer: >60
Potassium: 3.6
Total Bilirubin: 0.6

## 2010-11-22 LAB — BASIC METABOLIC PANEL
BUN: 14
Calcium: 9.1
Creatinine, Ser: 1.46
GFR calc Af Amer: >60
GFR calc non Af Amer: >60

## 2010-11-22 LAB — RAPID URINE DRUG SCREEN, HOSP PERFORMED
Amphetamines: NOT DETECTED
Barbiturates: NOT DETECTED
Benzodiazepines: NOT DETECTED
Opiates: NOT DETECTED
Tetrahydrocannabinol: POSITIVE — AB

## 2010-11-22 LAB — URINALYSIS, ROUTINE W REFLEX MICROSCOPIC
Glucose, UA: NEGATIVE
Nitrite: NEGATIVE
Protein, ur: 100 — AB
pH: 6

## 2010-11-22 LAB — HEPATIC FUNCTION PANEL
ALT: 13
AST: 33
Bilirubin, Direct: 0.2
Indirect Bilirubin: 0.4
Total Bilirubin: 0.6

## 2010-11-22 LAB — URINE MICROSCOPIC-ADD ON

## 2010-11-22 LAB — SALICYLATE LEVEL: Salicylate Lvl: <4

## 2010-11-23 LAB — BASIC METABOLIC PANEL
BUN: 5 — ABNORMAL LOW
Calcium: 9.2
GFR calc non Af Amer: >60
Glucose, Bld: 106 — ABNORMAL HIGH
Sodium: 142

## 2010-11-23 LAB — CBC
Platelets: 137 — ABNORMAL LOW
RDW: 12.3
WBC: 5.9

## 2011-04-05 ENCOUNTER — Encounter (HOSPITAL_COMMUNITY): Payer: Self-pay | Admitting: Emergency Medicine

## 2011-04-05 ENCOUNTER — Emergency Department (HOSPITAL_COMMUNITY)
Admission: EM | Admit: 2011-04-05 | Discharge: 2011-04-06 | Disposition: A | Discharge status: Home / Self Care | Payer: 59 | Attending: Emergency Medicine | Admitting: Emergency Medicine

## 2011-04-05 DIAGNOSIS — R599 Enlarged lymph nodes, unspecified: Secondary | ICD-10-CM | POA: Insufficient documentation

## 2011-04-05 DIAGNOSIS — R51 Headache: Secondary | ICD-10-CM | POA: Insufficient documentation

## 2011-04-05 DIAGNOSIS — R509 Fever, unspecified: Secondary | ICD-10-CM | POA: Insufficient documentation

## 2011-04-05 DIAGNOSIS — R630 Anorexia: Secondary | ICD-10-CM | POA: Insufficient documentation

## 2011-04-05 DIAGNOSIS — J020 Streptococcal pharyngitis: Secondary | ICD-10-CM | POA: Insufficient documentation

## 2011-04-05 DIAGNOSIS — R5381 Other malaise: Secondary | ICD-10-CM | POA: Insufficient documentation

## 2011-04-05 DIAGNOSIS — R070 Pain in throat: Secondary | ICD-10-CM | POA: Insufficient documentation

## 2011-04-05 NOTE — ED Notes (Signed)
PT. REPORTS SORE THROAT FOR 2 DAYS WITH HEADACHE AND CHILLS.

## 2011-04-06 MED ORDER — ACETAMINOPHEN-CODEINE 120-12 MG/5ML PO SOLN: 10.0000 mL | Freq: Four times a day (QID) | ORAL | Status: AC | PRN

## 2011-04-06 MED ORDER — AMOXICILLIN 500 MG PO CAPS
500.0000 mg | ORAL_CAPSULE | Freq: Once | ORAL | Status: AC
  Administered 2011-04-06: 500 mg via ORAL
  Filled 2011-04-06: qty 1

## 2011-04-06 MED ORDER — AMOXICILLIN 500 MG PO CAPS: 500.0000 mg | ORAL_CAPSULE | Freq: Three times a day (TID) | ORAL | Status: AC

## 2011-04-06 MED ORDER — ACETAMINOPHEN-CODEINE 120-12 MG/5ML PO SOLN
10.0000 mL | Freq: Once | ORAL | Status: AC
  Administered 2011-04-06: 10 mL via ORAL
  Filled 2011-04-06: qty 10

## 2011-04-06 NOTE — ED Notes (Signed)
Pt c/o painful, sore throat, HA and body aches.  Girlfriend recently diagnosed with strep throat

## 2011-04-06 NOTE — ED Notes (Signed)
rx x 2, pt voiced understanding to f/u with PCP if sx do not improve

## 2011-04-06 NOTE — ED Provider Notes (Signed)
History     CSN: [id number]  Arrival date & time 04/05/11  2300   First MD Initiated Contact with Patient 04/06/11 0022      Chief Complaint  Patient presents with  . Sore Throat    (Consider location/radiation/quality/duration/timing/severity/associated sxs/prior treatment) HPI Comments: Patient here with sore throat for the past 2 days - states girlfriend recently got over strep throat - reports pain with swallowing, is able to swallow food and fluids, reports fever, chills, headache and body aches.  Patient is a 21 y.o. male presenting with pharyngitis. The history is provided by the patient. No language interpreter was used.  Sore Throat This is a new problem. The current episode started in the past 7 days. The problem occurs constantly. The problem has been unchanged. Associated symptoms include anorexia, chills, fatigue, a fever, headaches, a sore throat and swollen glands. Pertinent negatives include no abdominal pain, arthralgias, change in bowel habit, chest pain, congestion, coughing, diaphoresis, joint swelling, myalgias, nausea, neck pain, numbness, rash, urinary symptoms, vertigo, visual change, vomiting or weakness. The symptoms are aggravated by swallowing. He has tried nothing for the symptoms. The treatment provided no relief.    Past Medical History  Diagnosis Date  . Broken arm     left  . Genital warts 06/29/2010  . Other urethritis 10/02/2009  . HIDRADENITIS SUPPURATIVA 03/23/2010  . ANHEDONIA 11/05/2009    History reviewed. No pertinent past surgical history.  Family History  Problem Relation Age of Onset  . Cancer Maternal Grandfather     lung/ Smoker  . Alcohol abuse Paternal Grandmother     History  Substance Use Topics  . Smoking status: Current Some Day Smoker    Types: Cigarettes  . Smokeless tobacco: Never Used  . Alcohol Use: 0.0 oz/week    0 drink(s) per week     occasional      Review of Systems  Constitutional: Positive for fever, chills  and fatigue. Negative for diaphoresis.  HENT: Positive for sore throat. Negative for congestion and neck pain.   Respiratory: Negative for cough.   Cardiovascular: Negative for chest pain.  Gastrointestinal: Positive for anorexia. Negative for nausea, vomiting, abdominal pain and change in bowel habit.  Musculoskeletal: Negative for myalgias, joint swelling and arthralgias.  Skin: Negative for rash.  Neurological: Positive for headaches. Negative for vertigo, weakness and numbness.  All other systems reviewed and are negative.    Allergies  Review of patient's allergies indicates no known allergies.  Home Medications   Current Outpatient Rx  Name Route Sig Dispense Refill  . IBUPROFEN 200 MG PO TABS Oral Take 800 mg by mouth every 6 (six) hours as needed. For pain      BP 146/74  Pulse 82  Temp(Src) 97.8 F (36.6 C) (Oral)  Resp 20  SpO2 97%  Physical Exam  Nursing note and vitals reviewed. Constitutional: He is oriented to person, place, and time. He appears well-developed and well-nourished. No distress.  HENT:  Head: Normocephalic and atraumatic.  Right Ear: External ear normal.  Left Ear: External ear normal.  Nose: Nose normal.  Mouth/Throat: Mucous membranes are normal. Normal dentition. Oropharyngeal exudate present. No tonsillar abscesses.  Eyes: Conjunctivae are normal. Pupils are equal, round, and reactive to light. No scleral icterus.  Neck: Normal range of motion. Neck supple.       Anterior cervical lymphadenopathy  Cardiovascular: Normal rate, regular rhythm and normal heart sounds.  Exam reveals no gallop and no friction rub.   No  murmur heard. Pulmonary/Chest: Effort normal and breath sounds normal. No respiratory distress. He exhibits no tenderness.  Abdominal: Soft. Bowel sounds are normal. He exhibits no distension. There is no tenderness.  Musculoskeletal: Normal range of motion. He exhibits no edema and no tenderness.  Lymphadenopathy:    He has  cervical adenopathy.  Neurological: He is alert and oriented to person, place, and time. No cranial nerve deficit.  Skin: Skin is warm and dry. No rash noted. No erythema.  Psychiatric: He has a normal mood and affect. His behavior is normal. Judgment and thought content normal.    ED Course  Procedures (including critical care time)  Labs Reviewed  RAPID STREP SCREEN - Abnormal; Notable for the following:    Streptococcus, Group A Screen (Direct) POSITIVE (*)    All other components within normal limits   No results found.   Strep pharyngitis    MDM  Strep positive, given first dose of abx here - will write rx for same, no evidence of peritonsilar abscess, airway clear.        Izola Price [place], [place] 04/06/11 [phone]

## 2011-04-07 NOTE — ED Provider Notes (Signed)
Medical screening examination/treatment/procedure(s) were performed by non-physician practitioner and as supervising physician I was immediately available for consultation/collaboration.  Cyndra Numbers, MD 04/07/11 [phone]

## 2011-07-29 ENCOUNTER — Ambulatory Visit: Payer: 59 | Admitting: Family Medicine

## 2011-08-01 ENCOUNTER — Ambulatory Visit (INDEPENDENT_AMBULATORY_CARE_PROVIDER_SITE_OTHER): Payer: 59 | Admitting: Family Medicine

## 2011-08-01 ENCOUNTER — Encounter: Payer: Self-pay | Admitting: Family Medicine

## 2011-08-01 VITALS — BP 133/83 | HR 79 | Temp 98.8°F | Ht 76.0 in | Wt 233.0 lb

## 2011-08-01 DIAGNOSIS — L739 Follicular disorder, unspecified: Secondary | ICD-10-CM

## 2011-08-01 DIAGNOSIS — L732 Hidradenitis suppurativa: Secondary | ICD-10-CM

## 2011-08-01 DIAGNOSIS — L738 Other specified follicular disorders: Secondary | ICD-10-CM

## 2011-08-01 MED ORDER — CEFDINIR 300 MG PO CAPS: 300.0000 mg | ORAL_CAPSULE | Freq: Two times a day (BID) | ORAL | Status: AC

## 2011-08-01 NOTE — Progress Notes (Signed)
Patient ID: Travis Burns, male   DOB: May 08, 1990, 21 y.o.   MRN: [id number] Travis Burns [id number] Mar 11, 1990 08/01/2011      Progress Note-Follow Up  Subjective  Chief Complaint  Chief Complaint  Patient presents with  . Recurrent Skin Infections    boil on left side of buttocks    HPI  21 year old [place] American male who is in today with complaints of a recurrent lesion in his gluteal cleft. Is on the left. It enlarges sometimes drains becomes tender warm and painful and then improves greatly only to recur again. At present it is just a small nodule that is not tender or draining. He denies fevers, chills, malaise, myalgias, anorexia or signs of systemic illness. He notes if he showers daily and uses his surgical scrub the lesions do not occur but if he skips this he can recur. No other acute complaints. Does note a small bump on his lip but is unchanged and asymptomatic.   Past Medical History  Diagnosis Date  . Broken arm     left  . Genital warts 06/29/2010  . Other urethritis 10/02/2009  . HIDRADENITIS SUPPURATIVA 03/23/2010  . ANHEDONIA 11/05/2009    No past surgical history on file.  Family History  Problem Relation Age of Onset  . Cancer Maternal Grandfather     lung/ Smoker  . Alcohol abuse Paternal Grandmother     History   Social History  . Marital Status: Single    Spouse Name: N/A    Number of Children: N/A  . Years of Education: N/A   Occupational History  . Not on file.   Social History Main Topics  . Smoking status: Current Some Day Smoker    Types: Cigarettes  . Smokeless tobacco: Never Used  . Alcohol Use: 0.0 oz/week    0 drink(s) per week     occasional  . Drug Use: Yes    Special: Marijuana     marijuana  . Sexually Active: Not on file   Other Topics Concern  . Not on file   Social History Narrative  . No narrative on file    No current outpatient prescriptions on file prior to visit.    No Known Allergies  Review of  Systems  Review of Systems  Constitutional: Negative for fever and malaise/fatigue.  HENT: Negative for congestion.   Eyes: Negative for discharge.  Respiratory: Negative for shortness of breath.   Cardiovascular: Negative for chest pain, palpitations and leg swelling.  Gastrointestinal: Negative for nausea, abdominal pain and diarrhea.  Genitourinary: Negative for dysuria.  Musculoskeletal: Negative for falls.  Skin: Positive for itching and rash.       Recurrent in gluteal cleft, not flared today  Neurological: Negative for loss of consciousness and headaches.  Endo/Heme/Allergies: Negative for polydipsia.  Psychiatric/Behavioral: Negative for depression and suicidal ideas. The patient is not nervous/anxious and does not have insomnia.     Objective  BP 133/83  Pulse 79  Temp(Src) 98.8 F (37.1 C) (Temporal)  Ht 6\' 4"  (1.93 m)  Wt 233 lb (105.688 kg)  BMI 28.36 kg/m2  SpO2 95%  Physical Exam  Physical Exam  Constitutional: He is oriented to person, place, and time and well-developed, well-nourished, and in no distress. No distress.  HENT:  Head: Normocephalic and atraumatic.  Eyes: Conjunctivae are normal.  Neck: Neck supple. No thyromegaly present.  Cardiovascular: Normal rate, regular rhythm and normal heart sounds.   No murmur heard. Pulmonary/Chest: Effort normal and  breath sounds normal. No respiratory distress.  Abdominal: He exhibits no distension and no mass. There is no tenderness.  Musculoskeletal: He exhibits no edema.  Neurological: He is alert and oriented to person, place, and time.  Skin: Skin is warm.       Firm. Sq palpable nodule on left gluteal cleft  Psychiatric: Memory, affect and judgment normal.    No results found for this basename: TSH   Lab Results  Component Value Date   WBC 4.5 12/17/2009   HGB 14.6 12/17/2009   HCT 41.9 12/17/2009   MCV 96.7 12/17/2009   PLT 152.0 12/17/2009   Lab Results  Component Value Date   CREATININE  1.19 05/30/2007   BUN 5* 05/30/2007   NA 142 05/30/2007   K 3.6 05/30/2007   CL 108 05/30/2007   CO2 28 05/30/2007   Lab Results  Component Value Date   ALT 12 05/29/2007   AST 22 05/29/2007   ALKPHOS 72 05/29/2007   BILITOT 0.6 05/29/2007     Assessment & Plan  HIDRADENITIS SUPPURATIVA Recurrent gluteal cleft, encouraged daily cleanse with warm water and soap followed by H2O2, referred to general surgery for evaluation due to recurrent nature of lesion. Flares, drains and resolves. Given antibiotic to use in case it flares

## 2011-08-01 NOTE — Patient Instructions (Signed)
Hidradenitis Suppurativa, Sweat Gland Abscess Hidradenitis suppurativa is a long lasting (chronic), uncommon disease of the sweat glands. With this, boil-like lumps and scarring develop in the groin, some times under the arms (axillae), and under the breasts. It may also uncommonly occur behind the ears, in the crease of the buttocks, and around the genitals.  CAUSES  The cause is from a blocking of the sweat glands. They then become infected. It may cause drainage and odor. It is not contagious. So it cannot be given to someone else. It most often shows up in puberty (about 50 to 22 years of age). But it may happen much later. It is similar to acne which is a disease of the sweat glands. This condition is slightly more common in African-Americans and women. SYMPTOMS   Hidradenitis usually starts as one or more red, tender, swellings in the groin or under the arms (axilla).   Over a period of hours to days the lesions get larger. They often open to the skin surface, draining clear to yellow-colored fluid.   The infected area heals with scarring.  DIAGNOSIS  Your caregiver makes this diagnosis by looking at you. Sometimes cultures (growing germs on plates in the lab) may be taken. This is to see what germ (bacterium) is causing the infection.  TREATMENT   Topical germ killing medicine applied to the skin (antibiotics) are the treatment of choice. Antibiotics taken by mouth (systemic) are sometimes needed when the condition is getting worse or is severe.   Avoid tight-fitting clothing which traps moisture in.   Dirt does not cause hidradenitis and it is not caused by poor hygiene.   Involved areas should be cleaned daily using an antibacterial soap. Some patients find that the liquid form of Lever 2000, applied to the involved areas as a lotion after bathing, can help reduce the odor related to this condition.   Sometimes surgery is needed to drain infected areas or remove scarred tissue.  Removal of large amounts of tissue is used only in severe cases.   Birth control pills may be helpful.   Oral retinoids (vitamin A derivatives) for 6 to 12 months which are effective for acne may also help this condition.   Weight loss will improve but not cure hidradenitis. It is made worse by being overweight. But the condition is not caused by being overweight.   This condition is more common in people who have had acne.   It may become worse under stress.  There is no medical cure for hidradenitis. It can be controlled, but not cured. The condition usually continues for years with periods of getting worse and getting better (remission). Document Released: 09/29/2003 Document Revised: 02/03/2011 Document Reviewed: 10/15/2007 [facility] Patient Information 2012 [place], [place].  Cleanse after shower with witch hazel or peroxide

## 2011-08-01 NOTE — Assessment & Plan Note (Signed)
Recurrent gluteal cleft, encouraged daily cleanse with warm water and soap followed by H2O2, referred to general surgery for evaluation due to recurrent nature of lesion. Flares, drains and resolves. Given antibiotic to use in case it flares

## 2011-08-26 ENCOUNTER — Ambulatory Visit (INDEPENDENT_AMBULATORY_CARE_PROVIDER_SITE_OTHER): Payer: 59 | Admitting: Surgery

## 2011-08-26 ENCOUNTER — Encounter (INDEPENDENT_AMBULATORY_CARE_PROVIDER_SITE_OTHER): Payer: Self-pay | Admitting: Surgery

## 2011-08-26 VITALS — BP 128/70 | HR 80 | Temp 97.4°F | Resp 14 | Ht 77.0 in | Wt 237.0 lb

## 2011-08-26 DIAGNOSIS — K603 Anal fistula, unspecified: Secondary | ICD-10-CM

## 2011-08-26 NOTE — Progress Notes (Signed)
Patient ID: Travis Burns, male   DOB: 1990/08/12, 21 y.o.   MRN: [id number]  Chief Complaint  Patient presents with  . New Evaluation    Hidradenitis     HPI Travis Burns is a 21 y.o. male.   HPIPatient sent at the request of Dr. Binnie Kand due to drainage in the gluteal cleft. This has been present for 2 months. He complains of drainage from the gluteal cleft and then the area heals. Then the area swells up, drains and feels better. The drainage is purulent and sometimes bloody. He does have some mild to moderate anal pain with this. No difficulty with bowel or bladder function.  Past Medical History  Diagnosis Date  . Broken arm     left  . Genital warts 06/29/2010  . Other urethritis 10/02/2009  . HIDRADENITIS SUPPURATIVA 03/23/2010  . ANHEDONIA 11/05/2009    History reviewed. No pertinent past surgical history.  Family History  Problem Relation Age of Onset  . Cancer Maternal Grandfather     lung/ Smoker  . Alcohol abuse Paternal Grandmother     Social History History  Substance Use Topics  . Smoking status: Former Smoker    Types: Cigarettes    Quit date: 08/25/2009  . Smokeless tobacco: Never Used  . Alcohol Use: 0.0 oz/week    0 drink(s) per week     occasional    No Known Allergies  No current outpatient prescriptions on file.    Review of Systems Review of Systems  Constitutional: Negative.   HENT: Negative.   Eyes: Negative.   Respiratory: Negative.   Cardiovascular: Negative.   Gastrointestinal: Positive for anal bleeding and rectal pain.  Genitourinary: Negative.   Musculoskeletal: Negative.   Skin: Negative.   Neurological: Negative.   Hematological: Negative.   Psychiatric/Behavioral: Negative.     Blood pressure 128/70, pulse 80, temperature 97.4 F (36.3 C), temperature source Temporal, resp. rate 14, height 6\' 5"  (1.956 m), weight 237 lb (107.502 kg).  Physical Exam Physical Exam  Constitutional: He is oriented to person, place, and time. He  appears well-developed and well-nourished.  HENT:  Head: Normocephalic and atraumatic.  Eyes: EOM are normal. Pupils are equal, round, and reactive to light.  Neck: Normal range of motion. Neck supple.  Cardiovascular: Normal rate and regular rhythm.   Pulmonary/Chest: Effort normal and breath sounds normal.  Abdominal: Soft. Bowel sounds are normal.  Genitourinary:     Neurological: He is alert and oriented to person, place, and time.  Skin: Skin is warm and dry.  Psychiatric: He has a normal mood and affect. His behavior is normal. Judgment and thought content normal.      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  This is not hidradenitis or pilonidal disease.  Observation vs exam under anesthesia discussed. Risks ,  Benefits and alternative therapies discussed.  Risks include bleeding, infection, incontinence, need for more surgery, and organ injury. He wishes to proceed with exam under anesthesia.       Kiaya Haliburton A. 08/26/2011, 9:50 AM

## 2011-08-26 NOTE — Patient Instructions (Signed)
Anal Fistula An anal fistula is an abnormal tunnel that leads from the anal canal (which carries stool from the large intestine) to a hole in the skin near the anus (the opening through which stool passes out of your body).  CAUSES  Food you eat goes from your stomach into your intestine. As the food is digested, waste material (stool) forms. Stool passes through your large intestine, through the rectum and anal canal, and out of your body through the anus.  The anus has a number of tiny glands (clusters of specialized cells) that make lubricating fluid. Sometimes these glands can become infected. This type of infection may lead to the development of a pocket of pus (abscess). An anal fistula often develops after an infection or abscess; It is nearly always caused by a past anorectal abscess. You are at a higher risk of developing an anal fistula if you have:  Had an anal abscess.   Chronic inflammatory bowel disease, such as Crohn's disease or ulcerative colitis.   Conditions in which there are inflamed outpouchings of the intestinal wall (diverticulitis).   Colon or rectal cancer.   Sexually transmitted diseases involving the rectum, such as gonorrhea or chlamydia.   A history of anal radiation treatments, injury, or surgery.   An HIV infection.   A problem that has required treatment with steroid medicines for more than a short time.  SYMPTOMS   Anal pain, particularly around the area of a past abscess.   Drainage of pus, blood, stool or mucus from an opening in the skin.   Swelling around the skin opening.   Worn off skin around the opening.   A hot or red area near the anus.   Diarrhea.   Fever and chills.   Tiredness (fatigue).  DIAGNOSIS   In some cases, the opening of an anal fistula is easily seen during a physical exam.   A probe or scope may be used to help locate the opening of the fistula. In some cases, dye can be injected into the fistula opening, and X-rays  can be taken to find the exact location and path of the fistula.   A sample (biopsy) of the fistula tissue or anus may be taken to check for cancer.  TREATMENT   An anal fistula may need surgery to open it up and allow it to heal. This type of operation is called a fistulotomy.   A specialized kind of glue or plug to seal the fistula may be used.   An antibiotic may be prescribed to treat an existing infection.  HOME CARE INSTRUCTIONS   Take medications (such as antibiotics) as prescribed by your caregiver.   Only take over-the-counter or prescription medicine for pain, discomfort, or fever as directed by your caregiver.   Follow your prescribed diet. You may need a higher fiber diet to help avoid constipation.   Drink lots of water as directed.   Use a stool softener or laxative, if recommended.   A warm sitz bath several times a day may be soothing, as well as help with healing.   Follow excellent hygiene to keep the anal area as clean as possible. Consider using pre-moistened towelettes to keep the anal area clean after using the bathroom.  SEEK MEDICAL CARE IF:  You have increased pain not controlled with medications.   You notice new swelling, redness, or hotness in the anal area.   You develop any problems passing urine.   You develop a fever (more   than 100.5 F (38.1 C).  SEEK IMMEDIATE MEDICAL CARE IF:  You have severe, intolerable pain.   You have severe problems passing urine or cannot pass any urine at all.   You develop an unexplained oral temperature above 102.0 F (38.9 C).   You notice new or worsening leakage of blood, pus, mucus, or stool.  Document Released: 01/28/2008 Document Revised: 02/03/2011 Document Reviewed: 01/28/2008 [employer] Patient Information 2012 [employer]. 

## 2011-08-31 DIAGNOSIS — K612 Anorectal abscess: Secondary | ICD-10-CM

## 2011-09-02 ENCOUNTER — Telehealth: Payer: Self-pay | Admitting: Family Medicine

## 2011-09-02 ENCOUNTER — Ambulatory Visit: Payer: 59 | Admitting: Family Medicine

## 2011-09-02 NOTE — Telephone Encounter (Signed)
Dr McGowen, please advise. 

## 2011-09-02 NOTE — Telephone Encounter (Signed)
Nasal saline spray in both nostrils several times a day. Try allegra D or zytec D or Claritin D as directed (has to ask "behind the counter" for this). May also try mucinex DM.  May also take tylenol OR ibuprofen with any of these meds.

## 2011-09-02 NOTE — Telephone Encounter (Signed)
Mother notified.  She will advise Onalee Hua.

## 2011-09-06 ENCOUNTER — Telehealth (INDEPENDENT_AMBULATORY_CARE_PROVIDER_SITE_OTHER): Payer: Self-pay

## 2011-09-06 NOTE — Telephone Encounter (Signed)
Tried to call patient back, no answer. Left message to call back. Cornett suggest patient take benadryl and it will take a few weeks to heal. Cannot give exact timeline of when it will heal.

## 2011-09-06 NOTE — Telephone Encounter (Signed)
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  Please call to discuss her questions and recommendations for itching.  Call [phone] until 3:30pm and [phone] after 3:30pm.

## 2011-09-12 ENCOUNTER — Telehealth (INDEPENDENT_AMBULATORY_CARE_PROVIDER_SITE_OTHER): Payer: Self-pay

## 2011-09-12 NOTE — Telephone Encounter (Signed)
The patient's mom called to get him a refill on his pain med.  She didn't know what he is on so I called [facility] [phone].  He was prescribed Percocet.  I called in per our refill protocol Hydrocodone 5/325 one tab po q4-6 hrs prn pain #30 no refills.  I notified the mom it was done and asked her to call if it doesn't relieve his pain.

## 2011-09-27 ENCOUNTER — Encounter (INDEPENDENT_AMBULATORY_CARE_PROVIDER_SITE_OTHER): Payer: Self-pay | Admitting: Surgery

## 2011-09-27 ENCOUNTER — Ambulatory Visit (INDEPENDENT_AMBULATORY_CARE_PROVIDER_SITE_OTHER): Payer: 59 | Admitting: Surgery

## 2011-09-27 VITALS — BP 140/84 | HR 88 | Temp 98.2°F | Resp 20 | Ht 77.0 in | Wt 240.2 lb

## 2011-09-27 DIAGNOSIS — Z9889 Other specified postprocedural states: Secondary | ICD-10-CM

## 2011-09-27 NOTE — Progress Notes (Signed)
Patient returns after debridement of perianal abscess. He is doing well.  On exam he posterior. Wound is clean. It is just to left of midline involving the above. There is no redness.  Impression: Status post drainage of perianal abscess  Plan: Wound healing well. Continue current wound care. Follow up as needed.

## 2011-09-27 NOTE — Patient Instructions (Signed)
Return as needed

## 2011-10-26 ENCOUNTER — Encounter (INDEPENDENT_AMBULATORY_CARE_PROVIDER_SITE_OTHER): Payer: 59 | Admitting: Surgery

## 2011-10-27 ENCOUNTER — Encounter (INDEPENDENT_AMBULATORY_CARE_PROVIDER_SITE_OTHER): Payer: Self-pay | Admitting: Surgery

## 2011-10-27 ENCOUNTER — Ambulatory Visit (INDEPENDENT_AMBULATORY_CARE_PROVIDER_SITE_OTHER): Payer: 59 | Admitting: Family Medicine

## 2011-10-27 ENCOUNTER — Encounter: Payer: Self-pay | Admitting: Family Medicine

## 2011-10-27 ENCOUNTER — Ambulatory Visit (INDEPENDENT_AMBULATORY_CARE_PROVIDER_SITE_OTHER): Payer: 59 | Admitting: Surgery

## 2011-10-27 VITALS — BP 145/89 | HR 77 | Temp 97.4°F | Ht 77.0 in | Wt 235.8 lb

## 2011-10-27 VITALS — BP 130/78 | HR 74 | Temp 97.6°F | Resp 18 | Ht 77.0 in | Wt 235.4 lb

## 2011-10-27 DIAGNOSIS — K603 Anal fistula: Secondary | ICD-10-CM

## 2011-10-27 DIAGNOSIS — R109 Unspecified abdominal pain: Secondary | ICD-10-CM

## 2011-10-27 LAB — URINALYSIS, ROUTINE W REFLEX MICROSCOPIC
Bilirubin Urine: NEGATIVE
Hgb urine dipstick: NEGATIVE
Ketones, ur: NEGATIVE
Nitrite: NEGATIVE
Total Protein, Urine: NEGATIVE
pH: 6 (ref 5.0–8.0)

## 2011-10-27 LAB — LIPASE: Lipase: 31 U/L (ref 11.0–59.0)

## 2011-10-27 LAB — COMPREHENSIVE METABOLIC PANEL
AST: 19 U/L (ref 0–37)
Albumin: 4 g/dL (ref 3.5–5.2)
Alkaline Phosphatase: 67 U/L (ref 39–117)
BUN: 15 mg/dL (ref 6–23)
Calcium: 9.1 mg/dL (ref 8.4–10.5)
Chloride: 108 mEq/L (ref 96–112)
Glucose, Bld: 92 mg/dL (ref 70–99)
Potassium: 3.5 mEq/L (ref 3.5–5.1)
Sodium: 141 mEq/L (ref 135–145)
Total Protein: 7.2 g/dL (ref 6.0–8.3)

## 2011-10-27 LAB — CBC WITH DIFFERENTIAL/PLATELET
Basophils Relative: 0.9 % (ref 0.0–3.0)
Eosinophils Relative: 4.9 % (ref 0.0–5.0)
Lymphocytes Relative: 43.2 % (ref 12.0–46.0)
Monocytes Relative: 7.3 % (ref 3.0–12.0)
Neutrophils Relative %: 43.7 % (ref 43.0–77.0)
Platelets: 156 10*3/uL (ref 150.0–400.0)
RBC: 4.41 Mil/uL (ref 4.22–5.81)
WBC: 4.4 10*3/uL — ABNORMAL LOW (ref 4.5–10.5)

## 2011-10-27 NOTE — Progress Notes (Signed)
OFFICE NOTE  10/31/2011  CC:  Chief Complaint  Patient presents with  . Flank Pain    X 6 days- nothing seems to help the pain- laying, sitting, standing     HPI: Patient is a 21 y.o. African-American male who is here for pain in his left side. Onset about 6 days ago, left side below ribs, worse with turning/twisting and with coughing, 9/10 intensity, no n/v.  Denies preceding strain/trauma.  Denies gross blood in urine, no coca-cola colored urine.  No urinary urgency, frequency, or dysuria. Intake of food doesn't make it hurt worse.  Question of early satiety lately. No constipation or diarrhea.  New cough, productive of some mucous, without fever but then patient adds that this cough is only occasional, has nearly completely cleared up and only requires occas cough drop.  No SOB or wheezing. Some nasal congestion with this, mild ST initially but this resolved.  No rash on skin at the site of side pain. Has tried vicodin but this doesn't help.  Has not tried heat or massage.  Pertinent PMH:  Past Medical History  Diagnosis Date  . Broken arm     left  . Genital warts 06/29/2010  . Other urethritis 10/02/2009  . HIDRADENITIS SUPPURATIVA 03/23/2010  . ANHEDONIA 11/05/2009    MEDS:  Outpatient Prescriptions Prior to Visit  Medication Sig Dispense Refill  . HYDROcodone-acetaminophen (NORCO/VICODIN) 5-325 MG per tablet Take 5 mg by mouth as needed.        PE: Blood pressure 145/89, pulse 77, temperature 97.4 F (36.3 C), temperature source Temporal, height 6\' 5"  (1.956 m), weight 235 lb 12.8 oz (106.958 kg), SpO2 98.00%. Gen: Alert, well appearing.  Patient is oriented to person, place, time, and situation. AFFECT: flat, answers questions usually with a minimally audible yeah or nah.  When asked to expand on a symptoms or a thought, he basically repeats himself or just shrugs his shoulders.  He does not seem to be in any pain in the exam room today.   ENT: Eyes: no injection, icteris,  swelling, or exudate.  EOMI, PERRLA. Nose: no drainage or turbinate edema/swelling.  No injection or focal lesion.  Mouth: lips without lesion/swelling.  Oral mucosa pink and moist.  Dentition intact and without obvious caries or gingival swelling.  Oropharynx without erythema, exudate, or swelling.  Neck - No masses or thyromegaly or limitation in range of motion CV: RRR, no m/r/g.   LUNGS: CTA bilat, nonlabored resps, good aeration in all lung fields. ABD: soft, mild TTP in LUQ, left side just near jxn of ribs and abd, and in left CVA region.  No guarding or rebound.  BS normal.  No mass, bruit, or HSM.  No rash.   EXT: no clubbing, cyanosis, or edema.    IMPRESSION AND PLAN:  Abdominal pain LUQ/left side; sounds musculoskeletal. Reassured patient. Will check plain x-rays of the region (acute abd series), and will check UA with micro, CBC, Lipase, and CMET. GAve samples of celebrex 200mg , take 1 qd x 7-10d.  Therapeutic expectations and side effect profile of medication discussed today.  Patient's questions answered.    This patient gives off a sense of nonchalance, but I think he actually is anxious and would not be surprised if all testing ends up returning normal.  He is certainly hard to read/figure out, and we'll keep an eye out for any worrisome etiology of his pains but at this time i think this pain is benign.  FOLLOW UP: 7-10d

## 2011-10-27 NOTE — Patient Instructions (Addendum)
Take 200mg  celebrex once daily with food (samples given today).

## 2011-10-27 NOTE — Progress Notes (Signed)
General Surgery [facility]  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 Patient had undergone incision and drainage by Dr. Harriette Bouillon. Wound had essentially healed.  Over the past several days the patient had developed some swelling discomfort and then spontaneous drainage 2 days ago. He has seen a small amount of bleeding. He presents today for evaluation.  EXAM: Examination of the perineum shows a clean granulating wound in the posterior midline just to the right of center. This measures less than 1 cm in diameter. The wound is quite friable. There is no drainage. Wound is probed with a blunt tipped probe but no fistula tract could be identified. Dry gauze dressing is placed  IMPRESSION: Status post incision and drainage of perianal abscess, rule out fistula in ano formation  PLAN: I discussed the above findings with the patient. I have given him instructions for local wound care. I've asked him to return to see Dr. Luisa Hart in one week for evaluation.  Velora Heckler, MD, FACS General & Endocrine Surgery [facility]

## 2011-10-27 NOTE — Patient Instructions (Signed)
ANORECTAL PROCEDURES: 1.  Tub soaks 2-3 times daily in warm water (may add Epsom salts if desired) 2.  Stool softener for one month (store brand Miralax or Colace) 3.  Avoid toilet paper - use baby wipes or Tucks pads 4.  Increase water intake - 6-8 glasses daily 5.  Apply dry pad to area until drainage stops 

## 2011-10-31 DIAGNOSIS — R109 Unspecified abdominal pain: Secondary | ICD-10-CM | POA: Insufficient documentation

## 2011-10-31 NOTE — Assessment & Plan Note (Signed)
LUQ/left side; sounds musculoskeletal. Reassured patient. Will check plain x-rays of the region (acute abd series), and will check UA with micro, CBC, Lipase, and CMET. GAve samples of celebrex 200mg , take 1 qd x 7-10d.  Therapeutic expectations and side effect profile of medication discussed today.  Patient's questions answered.

## 2011-11-04 ENCOUNTER — Encounter: Payer: Self-pay | Admitting: *Deleted

## 2011-11-04 ENCOUNTER — Encounter (INDEPENDENT_AMBULATORY_CARE_PROVIDER_SITE_OTHER): Payer: 59 | Admitting: Surgery

## 2011-11-04 NOTE — Progress Notes (Signed)
Several attempts to contact patient.  Automated message stating customer not available.  Letter mailed.

## 2011-11-15 ENCOUNTER — Encounter (INDEPENDENT_AMBULATORY_CARE_PROVIDER_SITE_OTHER): Payer: Self-pay | Admitting: Surgery

## 2012-01-30 ENCOUNTER — Ambulatory Visit: Payer: 59 | Admitting: Family Medicine

## 2012-01-31 ENCOUNTER — Ambulatory Visit (INDEPENDENT_AMBULATORY_CARE_PROVIDER_SITE_OTHER): Payer: 59 | Admitting: Family Medicine

## 2012-01-31 ENCOUNTER — Encounter: Payer: Self-pay | Admitting: Family Medicine

## 2012-01-31 VITALS — BP 143/89 | HR 78 | Temp 98.6°F | Ht 77.0 in | Wt 240.0 lb

## 2012-01-31 DIAGNOSIS — N489 Disorder of penis, unspecified: Secondary | ICD-10-CM

## 2012-01-31 DIAGNOSIS — N342 Other urethritis: Secondary | ICD-10-CM

## 2012-01-31 DIAGNOSIS — N4889 Other specified disorders of penis: Secondary | ICD-10-CM

## 2012-01-31 DIAGNOSIS — J40 Bronchitis, not specified as acute or chronic: Secondary | ICD-10-CM

## 2012-01-31 DIAGNOSIS — L259 Unspecified contact dermatitis, unspecified cause: Secondary | ICD-10-CM

## 2012-01-31 DIAGNOSIS — L309 Dermatitis, unspecified: Secondary | ICD-10-CM | POA: Insufficient documentation

## 2012-01-31 HISTORY — DX: Dermatitis, unspecified: L30.9

## 2012-01-31 HISTORY — DX: Bronchitis, not specified as acute or chronic: J40

## 2012-01-31 MED ORDER — FLUCONAZOLE 150 MG PO TABS: ORAL_TABLET | ORAL | Status: DC

## 2012-01-31 MED ORDER — MUPIROCIN 2 % EX OINT: TOPICAL_OINTMENT | Freq: Two times a day (BID) | CUTANEOUS | Status: DC

## 2012-01-31 MED ORDER — DOXYCYCLINE HYCLATE 100 MG PO TABS: 100.0000 mg | ORAL_TABLET | Freq: Two times a day (BID) | ORAL | Status: DC

## 2012-01-31 NOTE — Assessment & Plan Note (Signed)
Erythematous base with crusting suspicious for candida vs bacterial, will treat with Diflucan 150 mg twice, probiotics and Mupirocin ointment bid til lesions gone, call if no improvement

## 2012-01-31 NOTE — Assessment & Plan Note (Signed)
Doxycycline 100 mg po bid x 10 days and probiotics

## 2012-01-31 NOTE — Assessment & Plan Note (Signed)
Penile culture taken today, no suspicious discharge.

## 2012-01-31 NOTE — Patient Instructions (Addendum)

## 2012-01-31 NOTE — Progress Notes (Signed)
Patient ID: Travis Burns, male   DOB: Jul 26, 1990, 21 y.o.   MRN: [id number] Galo Sayed [id number] January 24, 1991 01/31/2012      Progress Note-Follow Up  Subjective  Chief Complaint  Chief Complaint  Patient presents with  . Sinusitis    X 2 weeks- cough w/ phlegm (yellow)    HPI  Patient is a 21 year old male who is in today for evaluation of multiple concerns. About 2 weeks ago he had a sore throat and nasal congestion and sinus pressure. The initial symptoms are improving but cough which is productive of yellow phlegm is persistent. He denies any chills or present fevers no abdominal or back pain. No ear pain. No GI complaints. He is complaining of some irritation are on the penile skin. He denies any discharge or actual pain but does note some irritation at the tip as well as along the rim of the glans. No new partners him and he reports using a barrier method consistently. No urinary complaints such as dysuria or frequency.  Past Medical History  Diagnosis Date  . Broken arm     left  . Genital warts 06/29/2010  . Other urethritis 10/02/2009  . HIDRADENITIS SUPPURATIVA 03/23/2010  . ANHEDONIA 11/05/2009  . Dermatitis 01/31/2012  . Bronchitis 01/31/2012    History reviewed. No pertinent past surgical history.  Family History  Problem Relation Age of Onset  . Cancer Maternal Grandfather     lung/ Smoker  . Alcohol abuse Paternal Grandmother     History   Social History  . Marital Status: Single    Spouse Name: N/A    Number of Children: N/A  . Years of Education: N/A   Occupational History  . Not on file.   Social History Main Topics  . Smoking status: Former Smoker    Types: Cigarettes    Quit date: 08/25/2009  . Smokeless tobacco: Never Used  . Alcohol Use: 0.0 oz/week    0 drink(s) per week     Comment: occasional  . Drug Use: No     Comment: marijuana  . Sexually Active: Not Currently -- Male partner(s)   Other Topics Concern  . Not on file   Social  History Narrative  . No narrative on file    Current Outpatient Prescriptions on File Prior to Visit  Medication Sig Dispense Refill  . HYDROcodone-acetaminophen (NORCO/VICODIN) 5-325 MG per tablet Take 5 mg by mouth as needed.        No Known Allergies  Review of Systems  Review of Systems  Constitutional: Negative for fever and malaise/fatigue.  HENT: Positive for congestion and sore throat.   Eyes: Negative for discharge.  Respiratory: Positive for cough. Negative for shortness of breath.   Cardiovascular: Negative for chest pain, palpitations and leg swelling.  Gastrointestinal: Negative for nausea, abdominal pain and diarrhea.  Genitourinary: Negative for dysuria.  Musculoskeletal: Negative for myalgias and falls.  Skin: Positive for rash.  Neurological: Negative for loss of consciousness and headaches.  Endo/Heme/Allergies: Negative for polydipsia.  Psychiatric/Behavioral: Negative for depression and suicidal ideas. The patient is not nervous/anxious and does not have insomnia.     Objective  BP 143/89  Pulse 78  Temp 98.6 F (37 C) (Temporal)  Ht 6\' 5"  (1.956 m)  Wt 240 lb (108.863 kg)  BMI 28.46 kg/m2  SpO2 97%  Physical Exam  Physical Exam  Constitutional: He is oriented to person, place, and time and well-developed, well-nourished, and in no distress. No distress.  HENT:  Head: Normocephalic and atraumatic.  Eyes: Conjunctivae normal are normal.  Neck: Neck supple. No thyromegaly present.  Cardiovascular: Normal rate, regular rhythm and normal heart sounds.   No murmur heard. Pulmonary/Chest: Effort normal and breath sounds normal. No respiratory distress.  Abdominal: He exhibits no distension and no mass. There is no tenderness.  Genitourinary: No discharge found.       Erythema noted at tip of glans and along rim of glans with crusting noted along rim. Testicles normal b/l. No lymph nodes noted  Musculoskeletal: He exhibits no edema.  Neurological: He  is alert and oriented to person, place, and time.  Skin: Skin is warm.  Psychiatric: Memory, affect and judgment normal.    No results found for this basename: TSH   Lab Results  Component Value Date   WBC 4.4* 10/27/2011   HGB 14.1 10/27/2011   HCT 42.1 10/27/2011   MCV 95.6 10/27/2011   PLT 156.0 10/27/2011   Lab Results  Component Value Date   CREATININE 1.1 10/27/2011   BUN 15 10/27/2011   NA 141 10/27/2011   K 3.5 10/27/2011   CL 108 10/27/2011   CO2 24 10/27/2011   Lab Results  Component Value Date   ALT 11 10/27/2011   AST 19 10/27/2011   ALKPHOS 67 10/27/2011   BILITOT 0.5 10/27/2011     Assessment & Plan  Dermatitis Erythematous base with crusting suspicious for candida vs bacterial, will treat with Diflucan 150 mg twice, probiotics and Mupirocin ointment bid til lesions gone, call if no improvement  OTHER URETHRITIS Penile culture taken today, no suspicious discharge.  Bronchitis Doxycycline 100 mg po bid x 10 days and probiotics

## 2012-02-02 NOTE — Progress Notes (Signed)
Quick Note:  Patient Informed and voiced understanding ______ 

## 2012-02-02 NOTE — Progress Notes (Signed)
Quick Note:  Patient Informed and voiced understanding.  Pt would like to know how long it takes for the medicine to work on his private area? Please advise? ______

## 2012-02-03 LAB — WOUND CULTURE: Gram Stain: NONE SEEN

## 2012-03-04 ENCOUNTER — Other Ambulatory Visit: Payer: Self-pay | Admitting: Family Medicine

## 2012-03-16 ENCOUNTER — Ambulatory Visit: Payer: 59 | Admitting: Family Medicine

## 2012-03-30 ENCOUNTER — Encounter: Payer: Self-pay | Admitting: Family Medicine

## 2012-03-30 ENCOUNTER — Ambulatory Visit (INDEPENDENT_AMBULATORY_CARE_PROVIDER_SITE_OTHER): Payer: 59 | Admitting: Family Medicine

## 2012-03-30 VITALS — BP 126/83 | HR 77 | Temp 99.0°F | Ht 77.0 in | Wt 250.4 lb

## 2012-03-30 DIAGNOSIS — N342 Other urethritis: Secondary | ICD-10-CM

## 2012-03-30 DIAGNOSIS — L732 Hidradenitis suppurativa: Secondary | ICD-10-CM

## 2012-03-30 DIAGNOSIS — M25519 Pain in unspecified shoulder: Secondary | ICD-10-CM

## 2012-03-30 DIAGNOSIS — M25511 Pain in right shoulder: Secondary | ICD-10-CM

## 2012-03-30 DIAGNOSIS — E860 Dehydration: Secondary | ICD-10-CM

## 2012-03-30 DIAGNOSIS — K603 Anal fistula: Secondary | ICD-10-CM

## 2012-03-30 HISTORY — DX: Pain in right shoulder: M25.511

## 2012-03-30 HISTORY — DX: Dehydration: E86.0

## 2012-03-30 MED ORDER — CYCLOBENZAPRINE HCL 10 MG PO TABS: 10.0000 mg | ORAL_TABLET | Freq: Every evening | ORAL | Status: DC | PRN

## 2012-03-30 MED ORDER — MELOXICAM 15 MG PO TABS: 15.0000 mg | ORAL_TABLET | Freq: Every day | ORAL | Status: DC | PRN

## 2012-03-30 MED ORDER — SULFAMETHOXAZOLE-TRIMETHOPRIM 800-160 MG PO TABS: 1.0000 | ORAL_TABLET | Freq: Two times a day (BID) | ORAL | Status: DC

## 2012-03-30 NOTE — Patient Instructions (Addendum)
Shoulder Pain The shoulder is the joint that connects your arms to your body. The bones that form the shoulder joint include the upper arm bone (humerus), the shoulder blade (scapula), and the collarbone (clavicle). The top of the humerus is shaped like a ball and fits into a rather flat socket on the scapula (glenoid cavity). A combination of muscles and strong, fibrous tissues that connect muscles to bones (tendons) support your shoulder joint and hold the ball in the socket. Small, fluid-filled sacs (bursae) are located in different areas of the joint. They act as cushions between the bones and the overlying soft tissues and help reduce friction between the gliding tendons and the bone as you move your arm. Your shoulder joint allows a wide range of motion in your arm. This range of motion allows you to do things like scratch your back or throw a ball. However, this range of motion also makes your shoulder more prone to pain from overuse and injury. Causes of shoulder pain can originate from both injury and overuse and usually can be grouped in the following four categories:  Redness, swelling, and pain (inflammation) of the tendon (tendinitis) or the bursae (bursitis).  Instability, such as a dislocation of the joint.  Inflammation of the joint (arthritis).  Broken bone (fracture). HOME CARE INSTRUCTIONS   Apply ice to the sore area.  Put ice in a plastic bag.  Place a towel between your skin and the bag.  Leave the ice on for 15 to 20 minutes, 3 to 4 times per day for the first 2 days.  If you have a shoulder sling or immobilizer, wear it as long as your caregiver instructs. Only remove it to shower or bathe. Move your arm as little as possible, but keep your hand moving to prevent swelling.  Only take over-the-counter or prescription medicines for pain, discomfort, or fever as directed by your caregiver. SEEK MEDICAL CARE IF:   Your shoulder pain increases, or new pain develops in  your arm, hand, or fingers.  Your hand or fingers become cold and numb.  Your pain is not relieved with medicines. SEEK IMMEDIATE MEDICAL CARE IF:   Your arm, hand, or fingers are numb or tingling.  Your arm, hand, or fingers are significantly swollen or turn white or blue. MAKE SURE YOU:   Understand these instructions.  Will watch your condition.  Will get help right away if you are not doing well or get worse. Document Released: 11/24/2004 Document Revised: 05/09/2011 Document Reviewed: 01/29/2011 Exit[employer] Patient Information 2013 [employer].  

## 2012-03-30 NOTE — Assessment & Plan Note (Signed)
He noted his muscle spasm behind the scapula worse with walking sitting. Encouraged moist T. and gentle stretching. Flexeril each bedtime and Meloxicam every morning are provided. Work on good posture and report if symptoms do not resolve.

## 2012-03-30 NOTE — Assessment & Plan Note (Signed)
Has a follow up appt with surgery within the week. He reports a good response to surgical movement of his last lesion but then this past week he developed a new lesion just adjacent. It ruptured yesterday and she feels better today. No fevers or chills or discharge. Is given a paper copy of Bactrim to use as needed if the lesion

## 2012-03-30 NOTE — Progress Notes (Signed)
Patient ID: Travis Burns, male   DOB: 10/03/1990, 22 y.o.   MRN: [id number] Travis Burns [id number] 01-Nov-1990 03/30/2012      Progress Note-Follow Up  Subjective  Chief Complaint  Chief Complaint  Patient presents with  . Shoulder Pain    right shoulder X a couple of years- no injury that pt can remember    HPI  Patient is a 22 year old African [place] male who is in today complaining of multiple concerns. He noticed being very poor about hydrating and has some occasional dysuria. He is generally only with urination and there is no increasing discharge or other concerns. He is being seen by surgery next week in followup. He did have his surgery to remove his cysts near his rectum and did well with that initially. Then this past week he had new lesion appeared just proximal to that area and it became enlarged painful and ultimately ruptured and now feels fine. No fevers or chills, malaise or myalgias. No chest pain or palpitations.  Past Medical History  Diagnosis Date  . Broken arm     left  . Genital warts 06/29/2010  . Other urethritis 10/02/2009  . HIDRADENITIS SUPPURATIVA 03/23/2010  . ANHEDONIA 11/05/2009  . Dermatitis 01/31/2012  . Bronchitis 01/31/2012  . Dehydration 03/30/2012  . Shoulder pain, right 03/30/2012    History reviewed. No pertinent past surgical history.  Family History  Problem Relation Age of Onset  . Cancer Maternal Grandfather     lung/ Smoker  . Alcohol abuse Paternal Grandmother     History   Social History  . Marital Status: Single    Spouse Name: N/A    Number of Children: N/A  . Years of Education: N/A   Occupational History  . Not on file.   Social History Main Topics  . Smoking status: Former Smoker    Types: Cigarettes    Quit date: 08/25/2009  . Smokeless tobacco: Never Used  . Alcohol Use: 0.0 oz/week    0 drink(s) per week     Comment: occasional  . Drug Use: No     Comment: marijuana  . Sexually Active: Not Currently -- Male  partner(s)   Other Topics Concern  . Not on file   Social History Narrative  . No narrative on file    No current outpatient prescriptions on file prior to visit.    No Known Allergies  Review of Systems  Review of Systems  Constitutional: Negative for fever and malaise/fatigue.  HENT: Negative for congestion.   Eyes: Negative for discharge.  Respiratory: Negative for shortness of breath.   Cardiovascular: Negative for chest pain, palpitations and leg swelling.  Gastrointestinal: Negative for nausea, abdominal pain and diarrhea.  Genitourinary: Positive for dysuria. Negative for urgency, frequency and hematuria.  Musculoskeletal: Positive for joint pain. Negative for falls.       Right posterior shoulder pain  Skin: Negative for rash.  Neurological: Negative for loss of consciousness and headaches.  Endo/Heme/Allergies: Negative for polydipsia.  Psychiatric/Behavioral: Negative for depression and suicidal ideas. The patient is not nervous/anxious and does not have insomnia.     Objective  BP 126/83  Pulse 77  Temp 99 F (37.2 C) (Temporal)  Ht 6\' 5"  (1.956 m)  Wt 250 lb 6.4 oz (113.581 kg)  BMI 29.69 kg/m2  SpO2 97%  Physical Exam  Physical Exam  Constitutional: He is oriented to person, place, and time and well-developed, well-nourished, and in no distress. No distress.  HENT:  Head:  Normocephalic and atraumatic.  Eyes: Conjunctivae normal are normal.  Neck: Neck supple. No thyromegaly present.  Cardiovascular: Normal rate, regular rhythm and normal heart sounds.   No murmur heard. Pulmonary/Chest: Effort normal and breath sounds normal. No respiratory distress.  Abdominal: He exhibits no distension and no mass. There is no tenderness.  Musculoskeletal: He exhibits no edema.  Neurological: He is alert and oriented to person, place, and time.  Skin: Skin is warm.  Psychiatric: Memory, affect and judgment normal.    No results found for this basename: TSH    Lab Results  Component Value Date   WBC 4.4* 10/27/2011   HGB 14.1 10/27/2011   HCT 42.1 10/27/2011   MCV 95.6 10/27/2011   PLT 156.0 10/27/2011   Lab Results  Component Value Date   CREATININE 1.1 10/27/2011   BUN 15 10/27/2011   NA 141 10/27/2011   K 3.5 10/27/2011   CL 108 10/27/2011   CO2 24 10/27/2011   Lab Results  Component Value Date   ALT 11 10/27/2011   AST 19 10/27/2011   ALKPHOS 67 10/27/2011   BILITOT 0.5 10/27/2011     Assessment & Plan  Fistula-in-ano Has a follow up appt with surgery within the week. He reports a good response to surgical movement of his last lesion but then this past week he developed a new lesion just adjacent. It ruptured yesterday and she feels better today. No fevers or chills or discharge. Is given a paper copy of Bactrim to use as needed if the lesion  OTHER URETHRITIS Has a chronic low level discharge in the mornings which is unchanged is usually gray or white. Workup has been unremarkable. Appears to be physiologic for him. He does complain of occasional dysuria but it is only  Shoulder pain, right He noted his muscle spasm behind the scapula worse with walking sitting. Encouraged moist T. and gentle stretching. Flexeril each bedtime and Meloxicam every morning are provided. Work on good posture and report if symptoms do not resolve.

## 2012-03-30 NOTE — Assessment & Plan Note (Signed)
Has a chronic low level discharge in the mornings which is unchanged is usually gray or white. Workup has been unremarkable. Appears to be physiologic for him. He does complain of occasional dysuria but it is only

## 2012-04-02 ENCOUNTER — Ambulatory Visit (INDEPENDENT_AMBULATORY_CARE_PROVIDER_SITE_OTHER): Payer: 59 | Admitting: General Surgery

## 2012-04-05 ENCOUNTER — Encounter (INDEPENDENT_AMBULATORY_CARE_PROVIDER_SITE_OTHER): Payer: Self-pay | Admitting: General Surgery

## 2012-08-30 ENCOUNTER — Encounter: Payer: Self-pay | Admitting: Family Medicine

## 2012-08-30 ENCOUNTER — Ambulatory Visit (INDEPENDENT_AMBULATORY_CARE_PROVIDER_SITE_OTHER): Payer: 59 | Admitting: Family Medicine

## 2012-08-30 VITALS — BP 142/90 | HR 98 | Temp 98.1°F | Resp 18 | Ht 77.0 in | Wt 244.0 lb

## 2012-08-30 DIAGNOSIS — R21 Rash and other nonspecific skin eruption: Secondary | ICD-10-CM

## 2012-08-30 DIAGNOSIS — M899 Disorder of bone, unspecified: Secondary | ICD-10-CM

## 2012-08-30 DIAGNOSIS — M25511 Pain in right shoulder: Secondary | ICD-10-CM

## 2012-08-30 DIAGNOSIS — M949 Disorder of cartilage, unspecified: Secondary | ICD-10-CM

## 2012-08-30 DIAGNOSIS — N489 Disorder of penis, unspecified: Secondary | ICD-10-CM

## 2012-08-30 DIAGNOSIS — M25519 Pain in unspecified shoulder: Secondary | ICD-10-CM

## 2012-08-30 DIAGNOSIS — M898X1 Other specified disorders of bone, shoulder: Secondary | ICD-10-CM

## 2012-08-30 MED ORDER — CYCLOBENZAPRINE HCL 10 MG PO TABS: 10.0000 mg | ORAL_TABLET | Freq: Every evening | ORAL | Status: DC | PRN

## 2012-08-30 MED ORDER — MELOXICAM 15 MG PO TABS: 15.0000 mg | ORAL_TABLET | Freq: Every day | ORAL | Status: DC | PRN

## 2012-08-30 NOTE — Progress Notes (Addendum)
OFFICE NOTE  08/30/2012  CC:  Chief Complaint  Patient presents with  . Follow-up     HPI: Patient is a 22 y.o. African-American male who is here for rash noted on penis first noted about 3 wks ago.  No pain and no itching.  Describes it as a "little rash" but denies ulceration, blistering, or penile d/c.   Says right shoulder still hurts--has been a prob x 2 yrs or so, seems to be alleviated by turning neck to left -causes a popping noise and it briefly alleviates shoulder pain.  Further questioning reveals that it is actually his right scapular region that hurts.  No hx of injury to the area.  No loss of muscle mass, no sensory changes.  Flexeril and meloxicam in the past have been helping---uses them prn.  Pertinent PMH:  Past Medical History  Diagnosis Date  . Broken arm     left  . Genital warts 06/29/2010  . Other urethritis(597.89) 10/02/2009  . HIDRADENITIS SUPPURATIVA 03/23/2010  . ANHEDONIA 11/05/2009  . Dermatitis 01/31/2012  . Bronchitis 01/31/2012  . Dehydration 03/30/2012  . Shoulder pain, right 03/30/2012    MEDS:  NONE  PE: Blood pressure 142/90, pulse 98, temperature 98.1 F (36.7 C), temperature source Oral, resp. rate 18, height 6\' 5"  (1.956 m), weight 244 lb (110.678 kg), SpO2 98.00%. Gen: Alert, well appearing.  Patient is oriented to person, place, time, and situation. No tenderness to palpation of right shoulder girdle.  Right scapula nontender.  Abducting and raising arms up above head and behind his head seems to cause the discomfort in scapular region on the right. No asymmetry of scapular musculature.  UE strength 5/5 prox, dist.  DTRs in UEs 1+ and symmetric. GU: shaft of penis with pinkish macular oval-shaped lesion with irregular borders and superficial flaky/desquamation.  No nodule, no vesicle,no pustule, no ulcer.  The area is not tender to touch.  No swelling.  No groin LAD.  No penile d/c.  Testicles are normal.  IMPRESSION AND PLAN:  Pain of right  scapula PT recommended but pt declined this today. Will check x-ray of scapula. I RF'd his flexeril and meloxicam and we reviewed appropriate prn use of these meds.  Penile rash Unclear etiology but suspect tinea. Recommended lamisil cream otc bid until it is gone PLUS 3 additional days.   An After Visit Summary was printed and given to the patient.  FOLLOW UP:  6 months

## 2012-10-08 ENCOUNTER — Ambulatory Visit (INDEPENDENT_AMBULATORY_CARE_PROVIDER_SITE_OTHER): Payer: 59 | Admitting: Family Medicine

## 2012-10-08 ENCOUNTER — Encounter: Payer: Self-pay | Admitting: Family Medicine

## 2012-10-08 VITALS — BP 116/86 | HR 81 | Temp 99.0°F | Resp 16 | Ht 77.0 in | Wt 241.0 lb

## 2012-10-08 DIAGNOSIS — A630 Anogenital (venereal) warts: Secondary | ICD-10-CM

## 2012-10-08 DIAGNOSIS — M898X1 Other specified disorders of bone, shoulder: Secondary | ICD-10-CM | POA: Insufficient documentation

## 2012-10-08 MED ORDER — IMIQUIMOD 5 % EX CREA: TOPICAL_CREAM | CUTANEOUS | Status: DC

## 2012-10-08 NOTE — Progress Notes (Signed)
OFFICE NOTE  10/08/2012  CC:  Chief Complaint  Patient presents with  . Personal Problem     HPI: Patient is a 22 y.o. African-American male who is here for rash on penis that he first noted this morning.   No itching or pain.  Says his girlfriend was messing around with someone who apparently had "something on his penis".  No penile d/c.  He reports feeling well, otherwise.  Pertinent PMH:  Past Medical History  Diagnosis Date  . Broken arm     left  . Genital warts 06/29/2010  . Other urethritis(597.89) 10/02/2009  . HIDRADENITIS SUPPURATIVA 03/23/2010  . ANHEDONIA 11/05/2009  . Dermatitis 01/31/2012  . Bronchitis 01/31/2012  . Dehydration 03/30/2012  . Shoulder pain, right 03/30/2012    MEDS:  NONE  PE: Blood pressure 116/86, pulse 81, temperature 99 F (37.2 C), temperature source Temporal, resp. rate 16, height 6\' 5"  (1.956 m), weight 241 lb (109.317 kg), SpO2 99.00%. Gen: Alert, well appearing.  Patient is oriented to person, place, time, and situation. Penis: around glans on the dorsal region of the penis there is a grouping of very small verrucous-type lesions, and the skin in this region is hypopigmented/more pinkish compared to surrounding skin.  IMPRESSION AND PLAN:  Genital warts There is note in chart documenting similar lesions in 06/2010, aldara cream was rx'd.   Aldara 5% cream rx'd again today for nightly use x up to 8 wks. Discussed/reinforced the importance of condom use to prevent spread of this STD.  An After Visit Summary was printed and given to the patient.  FOLLOW UP: prn

## 2012-10-08 NOTE — Assessment & Plan Note (Signed)
There is note in chart documenting similar lesions in 06/2010, aldara cream was rx'd.   Aldara 5% cream rx'd again today for nightly use x up to 8 wks. Discussed/reinforced the importance of condom use to prevent spread of this STD.

## 2012-10-08 NOTE — Assessment & Plan Note (Signed)
Unclear etiology but suspect tinea. Recommended lamisil cream otc bid until it is gone PLUS 3 additional days.

## 2012-10-08 NOTE — Assessment & Plan Note (Signed)
PT recommended but pt declined this today. Will check x-ray of scapula. I RF'd his flexeril and meloxicam and we reviewed appropriate prn use of these meds.

## 2012-10-08 NOTE — Addendum Note (Signed)
Addended by: Jeoffrey Massed on: 10/08/2012 01:56 PM   Modules accepted: Level of Service

## 2012-10-09 ENCOUNTER — Telehealth: Payer: Self-pay | Admitting: Family Medicine

## 2012-10-09 MED ORDER — IMIQUIMOD 5 % EX CREA: TOPICAL_CREAM | CUTANEOUS | Status: DC

## 2012-10-09 NOTE — Telephone Encounter (Signed)
Patient states he never got his medication because [facility] stated they never got the rx.   I called to verify with [facility] and they did not get the rx.  I resent aldara cream to pharmacy.

## 2012-11-21 ENCOUNTER — Telehealth (INDEPENDENT_AMBULATORY_CARE_PROVIDER_SITE_OTHER): Payer: Self-pay | Admitting: General Surgery

## 2012-11-21 NOTE — Telephone Encounter (Signed)
Received call from pt's mother, Rosey Bath, who was calling from her work.  Son had asked her to call for appt today if possible.  Was seen by [facility] in July for perianal abscess.  Mother only told by son that he is in discomfort and needs to see MD again, but she has no direct information.  Obtained new cell phone number (updated in demographics) and attempted to call him for this information to possibly schedule into Urgent office.  Left VM to call me back.

## 2012-11-22 ENCOUNTER — Ambulatory Visit (INDEPENDENT_AMBULATORY_CARE_PROVIDER_SITE_OTHER): Payer: 59 | Admitting: Surgery

## 2012-11-22 ENCOUNTER — Encounter (INDEPENDENT_AMBULATORY_CARE_PROVIDER_SITE_OTHER): Payer: Self-pay | Admitting: Surgery

## 2012-11-22 VITALS — BP 138/90 | HR 68 | Temp 97.3°F | Resp 16 | Ht 77.0 in | Wt 243.4 lb

## 2012-11-22 DIAGNOSIS — K645 Perianal venous thrombosis: Secondary | ICD-10-CM | POA: Insufficient documentation

## 2012-11-22 NOTE — Patient Instructions (Signed)
Hemorrhoids Hemorrhoids are swollen veins around the rectum or anus. There are two types of hemorrhoids:   Internal hemorrhoids. These occur in the veins just inside the rectum. They may poke through to the outside and become irritated and painful.  External hemorrhoids. These occur in the veins outside the anus and can be felt as a painful swelling or hard lump near the anus. CAUSES  Pregnancy.   Obesity.   Constipation or diarrhea.   Straining to have a bowel movement.   Sitting for long periods on the toilet.  Heavy lifting or other activity that caused you to strain.  Anal intercourse. SYMPTOMS   Pain.   Anal itching or irritation.   Rectal bleeding.   Fecal leakage.   Anal swelling.   One or more lumps around the anus.  DIAGNOSIS  Your caregiver may be able to diagnose hemorrhoids by visual examination. Other examinations or tests that may be performed include:   Examination of the rectal area with a gloved hand (digital rectal exam).   Examination of anal canal using a small tube (scope).   A blood test if you have lost a significant amount of blood.  A test to look inside the colon (sigmoidoscopy or colonoscopy). TREATMENT Most hemorrhoids can be treated at home. However, if symptoms do not seem to be getting better or if you have a lot of rectal bleeding, your caregiver may perform a procedure to help make the hemorrhoids get smaller or remove them completely. Possible treatments include:   Placing a rubber band at the base of the hemorrhoid to cut off the circulation (rubber band ligation).   Injecting a chemical to shrink the hemorrhoid (sclerotherapy).   Using a tool to burn the hemorrhoid (infrared light therapy).   Surgically removing the hemorrhoid (hemorrhoidectomy).   Stapling the hemorrhoid to block blood flow to the tissue (hemorrhoid stapling).  HOME CARE INSTRUCTIONS   Eat foods with fiber, such as whole grains, beans,  nuts, fruits, and vegetables. Ask your doctor about taking products with added fiber in them (fibersupplements).  Increase fluid intake. Drink enough water and fluids to keep your urine clear or pale yellow.   Exercise regularly.   Go to the bathroom when you have the urge to have a bowel movement. Do not wait.   Avoid straining to have bowel movements.   Keep the anal area dry and clean. Use wet toilet paper or moist towelettes after a bowel movement.   Medicated creams and suppositories may be used or applied as directed.   Only take over-the-counter or prescription medicines as directed by your caregiver.   Take warm sitz baths for 15 20 minutes, 3 4 times a day to ease pain and discomfort.   Place ice packs on the hemorrhoids if they are tender and swollen. Using ice packs between sitz baths may be helpful.   Put ice in a plastic bag.   Place a towel between your skin and the bag.   Leave the ice on for 15 20 minutes, 3 4 times a day.   Do not use a donut-shaped pillow or sit on the toilet for long periods. This increases blood pooling and pain.  SEEK MEDICAL CARE IF:  You have increasing pain and swelling that is not controlled by treatment or medicine.  You have uncontrolled bleeding.  You have difficulty or you are unable to have a bowel movement.  You have pain or inflammation outside the area of the hemorrhoids. MAKE SURE YOU:    Understand these instructions.  Will watch your condition.  Will get help right away if you are not doing well or get worse. Document Released: 02/12/2000 Document Revised: 02/01/2012 Document Reviewed: 12/20/2011 ExitCare Patient Information 2014 [employer].  

## 2012-11-22 NOTE — Progress Notes (Signed)
Patient ID: Travis Burns, male   DOB: 04/25/1990, 22 y.o.   MRN: [id number]  Chief Complaint  Patient presents with  . Re-evaluation    new pain in same area as previous abscess    HPI Travis Burns is a 22 y.o. male.  Patient presents chief complaint of perianal and rectal pain. Isn't present for 2 weeks. The pain is constant and made worse with sitting. No history of difficult bowel movement or constipation. No drainage. Swelling noted along the left anal canal by patient. Recent history of treatment for anal condyloma with topical agents. History of perianal abscess drained one year ago after exam under anesthesia to exclude fistula. Pain is constant. 5/10. No bleeding from rectum noted. Does have some pain on the inside. HPI  Past Medical History  Diagnosis Date  . Broken arm     left  . Genital warts 06/29/2010  . Other urethritis(597.89) 10/02/2009  . HIDRADENITIS SUPPURATIVA 03/23/2010  . ANHEDONIA 11/05/2009  . Dermatitis 01/31/2012  . Bronchitis 01/31/2012  . Dehydration 03/30/2012  . Shoulder pain, right 03/30/2012  . Asthma     Past Surgical History  Procedure Laterality Date  . Treatment fistula anal      Family History  Problem Relation Age of Onset  . Cancer Maternal Grandfather     lung/ Smoker  . Alcohol abuse Paternal Grandmother     Social History History  Substance Use Topics  . Smoking status: Former Smoker    Types: Cigarettes    Quit date: 08/25/2009  . Smokeless tobacco: Never Used  . Alcohol Use: 0.0 oz/week    0 drink(s) per week     Comment: occasional    No Known Allergies  No current outpatient prescriptions on file.   No current facility-administered medications for this visit.    Review of Systems Review of Systems  Constitutional: Negative.   HENT: Negative.   Eyes: Negative.   Respiratory: Negative.   Cardiovascular: Negative.   Gastrointestinal: Positive for rectal pain.  Musculoskeletal: Negative.   Skin: Negative.     Hematological: Negative.   Psychiatric/Behavioral: Negative.     Blood pressure 138/90, pulse 68, temperature 97.3 F (36.3 C), temperature source Temporal, resp. rate 16, height 6\' 5"  (1.956 m), weight 243 lb 6.4 oz (110.406 kg).  Physical Exam Physical Exam  Constitutional: He is oriented to person, place, and time. He appears well-developed and well-nourished.  HENT:  Head: Normocephalic and atraumatic.  Eyes: Pupils are equal, round, and reactive to light. No scleral icterus.  Neck: Normal range of motion. Neck supple.  Genitourinary:     Neurological: He is alert and oriented to person, place, and time.  Skin: Skin is warm and dry.  Psychiatric: He has a normal mood and affect. His behavior is normal. Judgment and thought content normal.    Data Reviewed none  Assessment    Thrombosed external hemorrhoid left lateral column  Rectal pain  History of condyloma    Plan    Recommend exam under anesthesia to further evaluate his rectal pain and to excise the thrombosed external hemorrhoid which is tender but not the sole cause of his discomfort. Has history of perianal abscess which was debrided one year ago and needs to be evaluated fistula in ano as well.The procedure has been discussed with the patient.  Alternative therapies have been discussed with the patient.  Operative risks include bleeding,  Infection,  Organ injury,  Nerve injury,  Blood vessel injury,  DVT,  Pulmonary  embolism,  Death,  And possible reoperation.  Medical management risks include worsening of present situation.  The success of the procedure is 50 -90 % at treating patients symptoms.  The patient understands and agrees to proceed.       Franciscojavier Wronski A. 11/22/2012, 12:11 PM

## 2012-11-29 ENCOUNTER — Other Ambulatory Visit (INDEPENDENT_AMBULATORY_CARE_PROVIDER_SITE_OTHER): Payer: Self-pay | Admitting: Surgery

## 2012-11-29 ENCOUNTER — Telehealth (INDEPENDENT_AMBULATORY_CARE_PROVIDER_SITE_OTHER): Payer: Self-pay

## 2012-11-29 ENCOUNTER — Other Ambulatory Visit (INDEPENDENT_AMBULATORY_CARE_PROVIDER_SITE_OTHER): Payer: Self-pay | Admitting: *Deleted

## 2012-11-29 DIAGNOSIS — K645 Perianal venous thrombosis: Secondary | ICD-10-CM

## 2012-11-29 MED ORDER — POLYETHYLENE GLYCOL 3350 17 GM/SCOOP PO POWD: 17.0000 g | Freq: Once | ORAL | Status: DC

## 2012-11-29 MED ORDER — TRAMADOL HCL 50 MG PO TABS: 50.0000 mg | ORAL_TABLET | Freq: Two times a day (BID) | ORAL | Status: DC | PRN

## 2012-11-29 MED ORDER — OXYCODONE-ACETAMINOPHEN 5-325 MG PO TABS: 1.0000 | ORAL_TABLET | ORAL | Status: DC | PRN

## 2012-11-29 MED ORDER — LIDOCAINE HCL 2 % EX GEL: CUTANEOUS | Status: DC

## 2012-11-30 ENCOUNTER — Encounter (INDEPENDENT_AMBULATORY_CARE_PROVIDER_SITE_OTHER): Payer: Self-pay

## 2012-12-04 ENCOUNTER — Telehealth (INDEPENDENT_AMBULATORY_CARE_PROVIDER_SITE_OTHER): Payer: Self-pay

## 2012-12-04 ENCOUNTER — Encounter (INDEPENDENT_AMBULATORY_CARE_PROVIDER_SITE_OTHER): Payer: Self-pay | Admitting: General Surgery

## 2012-12-04 NOTE — Telephone Encounter (Signed)
LMOM> path benign 

## 2012-12-04 NOTE — Progress Notes (Signed)
Patient ID: Travis Burns, male   DOB: April 03, 1990, 22 y.o.   MRN: [id number] His mother called 12/03/2012 at approximately 11:00 PM.  She reported that he was postop day #4 from a hemorrhoidectomy. He just had his first bowel movement and was having severe anal pain following that. There was also blood with the stool. He was not actively bleeding at that time. I explained to her that this was not unusual after the first bowel movement following hemorrhoidectomy. I instructed her to have him take 2 of his narcotic pain pills as well as 600 mg of ibuprofen. He can may repeat the pain medicine every 4 hours as needed. I told her that if he has persistent bleeding that he should go to the emergency department. I did tell her that this would get better with time.

## 2012-12-04 NOTE — Telephone Encounter (Signed)
Message copied by Brennan Bailey on Tue Dec 04, 2012  4:37 PM ------      Message from: Harriette Bouillon A      Created: Mon Dec 03, 2012  4:03 PM       B9 hemorrhoid tissue ------

## 2012-12-05 ENCOUNTER — Other Ambulatory Visit (INDEPENDENT_AMBULATORY_CARE_PROVIDER_SITE_OTHER): Payer: Self-pay

## 2012-12-05 ENCOUNTER — Telehealth (INDEPENDENT_AMBULATORY_CARE_PROVIDER_SITE_OTHER): Payer: Self-pay

## 2012-12-05 DIAGNOSIS — G8918 Other acute postprocedural pain: Secondary | ICD-10-CM

## 2012-12-05 MED ORDER — OXYCODONE-ACETAMINOPHEN 5-325 MG PO TABS: 1.0000 | ORAL_TABLET | ORAL | Status: DC | PRN

## 2012-12-05 NOTE — Telephone Encounter (Signed)
Pt mother called and told prescription up at front desk for pick up.

## 2012-12-05 NOTE — Telephone Encounter (Signed)
Pt's mother calling asking for refill of pt's pain medication (oxycodone-acetaminophen 5-325 mg) says pt ran out. Mother also states that pt is bleeding with every bowel movement.  I let her know that this is common after this type of surgery.  Mother concerned that pt is loosing too much blood and does not want him to "have to get blood because of this."  Let mother know that I will send a message to Dr. Luisa Hart as well as Marcelino Duster.  Pt can be reached at [phone] / mother can be reached at [phone]

## 2012-12-06 NOTE — Telephone Encounter (Signed)
Normal. Not going to get blood routinely in this situation can refill if someone will sign.

## 2012-12-17 ENCOUNTER — Encounter (INDEPENDENT_AMBULATORY_CARE_PROVIDER_SITE_OTHER): Payer: 59 | Admitting: Surgery

## 2012-12-17 ENCOUNTER — Encounter (INDEPENDENT_AMBULATORY_CARE_PROVIDER_SITE_OTHER): Payer: Self-pay | Admitting: Surgery

## 2012-12-17 ENCOUNTER — Ambulatory Visit (INDEPENDENT_AMBULATORY_CARE_PROVIDER_SITE_OTHER): Payer: 59 | Admitting: Surgery

## 2012-12-17 VITALS — BP 138/92 | HR 72 | Temp 98.2°F | Resp 14 | Ht 77.0 in | Wt 245.4 lb

## 2012-12-17 DIAGNOSIS — Z9889 Other specified postprocedural states: Secondary | ICD-10-CM

## 2012-12-17 NOTE — Patient Instructions (Signed)
Do not need to soak in tub.  Shower ok.  No restrictions.  Expect some bleeding and discharge for 3 - 4 more weeks.  Return as needed.

## 2012-12-17 NOTE — Progress Notes (Signed)
Patient returns after hemorrhoidectomy. This was mostly external with no real significant internal component. It was thrombosed. No evidence of fistula in ano or abscess.  Exam: Anal canal wound is very clean. Good granulation tissue. No signs of infection.  Impression: Status post external hemorrhoidectomy now 3 weeks out doing well   Continue wound care. Okay to stop soaking into shower. Resume full activity. Expect drainage for 3-4 more weeks.

## 2013-04-28 DIAGNOSIS — R21 Rash and other nonspecific skin eruption: Secondary | ICD-10-CM

## 2013-04-28 HISTORY — DX: Rash and other nonspecific skin eruption: R21

## 2013-05-13 ENCOUNTER — Ambulatory Visit: Payer: 59 | Admitting: Family Medicine

## 2013-05-14 ENCOUNTER — Ambulatory Visit: Payer: 59 | Admitting: Family Medicine

## 2013-05-15 ENCOUNTER — Ambulatory Visit: Payer: 59 | Admitting: Family Medicine

## 2013-05-16 ENCOUNTER — Ambulatory Visit (INDEPENDENT_AMBULATORY_CARE_PROVIDER_SITE_OTHER): Payer: 59 | Admitting: Family Medicine

## 2013-05-16 ENCOUNTER — Encounter: Payer: Self-pay | Admitting: Family Medicine

## 2013-05-16 VITALS — BP 131/83 | HR 70 | Temp 98.4°F | Resp 18 | Ht 77.0 in | Wt 238.0 lb

## 2013-05-16 DIAGNOSIS — N4889 Other specified disorders of penis: Secondary | ICD-10-CM

## 2013-05-16 DIAGNOSIS — N489 Disorder of penis, unspecified: Secondary | ICD-10-CM

## 2013-05-16 NOTE — Addendum Note (Signed)
Addended by: Cydney OkAUGUSTIN, Janille Draughon N on: 05/16/2013 01:38 PM   Modules accepted: Orders

## 2013-05-16 NOTE — Progress Notes (Signed)
Pre visit review using our clinic review tool, if applicable. No additional management support is needed unless otherwise documented below in the visit note. 

## 2013-05-16 NOTE — Progress Notes (Signed)
OFFICE NOTE  05/16/2013  CC: No chief complaint on file.    HPI: Patient is a 23 y.o. African-American male who is here for onset of a "thing" on tip of penis noted about 2 wks ago. No itching or pain.   Says he is not having any intercourse. No penile d/c.  Pertinent PMH:  Past medical, surgical, social hx reviewed. Works for [employer].  Denies any past hx of STD.  MEDS:  NONE  PE: Blood pressure 131/83, pulse 70, temperature 98.4 F (36.9 C), temperature source Temporal, resp. rate 18, height 6\' 5"  (1.956 m), weight 238 lb (107.956 kg), SpO2 96.00%. Gen: Alert, well appearing.  Patient is oriented to person, place, time, and situation. GU: no groin adenopathy or lesion.  Penile shaft and scrotum/testes without lesion. Glans of penis has an oval, smooth, shiny pinkish plaque.  No vesicle or ulceration.  No warts. Meatus normal.  IMPRESSION AND PLAN:  Glans penis lesion, possible chancre. Will check RPR, ordered referral to dermatology.  An After Visit Summary was printed and given to the patient.  FOLLOW UP: prn

## 2013-05-17 LAB — RPR

## 2013-05-20 ENCOUNTER — Encounter: Payer: Self-pay | Admitting: Family Medicine

## 2013-07-26 ENCOUNTER — Encounter: Payer: Self-pay | Admitting: Family Medicine

## 2013-07-26 ENCOUNTER — Ambulatory Visit (INDEPENDENT_AMBULATORY_CARE_PROVIDER_SITE_OTHER): Payer: 59 | Admitting: Family Medicine

## 2013-07-26 VITALS — BP 132/74 | HR 71 | Temp 98.3°F | Ht 77.0 in | Wt 236.1 lb

## 2013-07-26 DIAGNOSIS — B354 Tinea corporis: Secondary | ICD-10-CM

## 2013-07-26 DIAGNOSIS — N342 Other urethritis: Secondary | ICD-10-CM

## 2013-07-26 MED ORDER — CETIRIZINE HCL 10 MG PO TABS: 10.0000 mg | ORAL_TABLET | Freq: Every day | ORAL | Status: DC | PRN

## 2013-07-26 MED ORDER — CLOBETASOL PROPIONATE 0.05 % EX CREA: 1.0000 "application " | TOPICAL_CREAM | Freq: Two times a day (BID) | CUTANEOUS | Status: DC

## 2013-07-26 MED ORDER — CLOTRIMAZOLE-BETAMETHASONE 1-0.05 % EX CREA: 1.0000 "application " | TOPICAL_CREAM | Freq: Two times a day (BID) | CUTANEOUS | Status: DC

## 2013-07-26 NOTE — Progress Notes (Signed)
Pre visit review using our clinic review tool, if applicable. No additional management support is needed unless otherwise documented below in the visit note. 

## 2013-07-26 NOTE — Patient Instructions (Signed)
Travis Burns.    Eczema Eczema, also called atopic dermatitis, is a skin disorder that causes inflammation of the skin. It causes a red rash and dry, scaly skin. The skin becomes very itchy. Eczema is generally worse during the cooler winter months and often improves with the warmth of summer. Eczema usually starts showing signs in infancy. Some children outgrow eczema, but it may last through adulthood.  CAUSES  The exact cause of eczema is not known, but it appears to run in families. People with eczema often have a family history of eczema, allergies, asthma, or hay fever. Eczema is not contagious. Flare-ups of the condition may be caused by:   Contact with something you are sensitive or allergic to.   Stress. SIGNS AND SYMPTOMS  Dry, scaly skin.   Red, itchy rash.   Itchiness. This may occur before the skin rash and may be very intense.  DIAGNOSIS  The diagnosis of eczema is usually made based on symptoms and medical history. TREATMENT  Eczema cannot be cured, but symptoms usually can be controlled with treatment and other strategies. A treatment plan might include:  Controlling the itching and scratching.   Use over-the-counter antihistamines as directed for itching. This is especially useful at night when the itching tends to be worse.   Use over-the-counter steroid creams as directed for itching.   Avoid scratching. Scratching makes the rash and itching worse. It may also result in a skin infection (impetigo) due to a break in the skin caused by scratching.   Keeping the skin well moisturized with creams every day. This will seal in moisture and help prevent dryness. Lotions that contain alcohol and water should be avoided because they can dry the skin.   Limiting exposure to things that you are sensitive or allergic to (allergens).   Recognizing situations that cause stress.   Developing a plan to manage stress.  HOME CARE INSTRUCTIONS   Only take  over-the-counter or prescription medicines as directed by your health care provider.   Do not use anything on the skin without checking with your health care provider.   Keep baths or showers short (5 minutes) in warm (not hot) water. Use mild cleansers for bathing. These should be unscented. You may add nonperfumed bath oil to the bath water. It is best to avoid soap and bubble bath.   Immediately after a bath or shower, when the skin is still damp, apply a moisturizing ointment to the entire body. This ointment should be a petroleum ointment. This will seal in moisture and help prevent dryness. The thicker the ointment, the better. These should be unscented.   Keep fingernails cut short. Children with eczema may need to wear soft gloves or mittens at night after applying an ointment.   Dress in clothes made of cotton or cotton blends. Dress lightly, because heat increases itching.   A child with eczema should stay away from anyone with fever blisters or cold sores. The virus that causes fever blisters (herpes simplex) can cause a serious skin infection in children with eczema. SEEK MEDICAL CARE IF:   Your itching interferes with sleep.   Your rash gets worse or is not better within 1 week after starting treatment.   You see pus or soft yellow scabs in the rash area.   You have a fever.   You have a rash flare-up after contact with someone who has fever blisters.  Document Released: 02/12/2000 Document Revised: 12/05/2012 Document Reviewed: 09/17/2012 Exit[employer] Patient Information  2014 [employer].  

## 2013-07-28 ENCOUNTER — Encounter: Payer: Self-pay | Admitting: Family Medicine

## 2013-07-28 DIAGNOSIS — B354 Tinea corporis: Secondary | ICD-10-CM | POA: Insufficient documentation

## 2013-07-28 NOTE — Assessment & Plan Note (Signed)
Right anterior abdomen and penile glans. Started on Lotrisone bid, if no response can try Clobetasol. Return if worsens

## 2013-07-28 NOTE — Assessment & Plan Note (Signed)
No discharge or concerns today

## 2013-07-28 NOTE — Progress Notes (Signed)
Patient ID: Travis Burns, male   DOB: 1990/08/22, 23 y.o.   MRN: [id number] Travis Burns [id number] 1991/01/20 07/28/2013      Progress Note-Follow Up  Subjective  Chief Complaint  Chief Complaint  Patient presents with  . Rash    HPI  Patient is a 23 year old male in today for routine medical care. He denies the recent new illness. He denies any new partners. She denies any fevers, chills, abdominal pain, penile discharge. He is complaining of a small circular rash on his right abdomen and his glands. Mildly itchy not painful. Denies CP/palp/SOB/HA/congestion/fevers/GI or GU c/o. Taking meds as prescribed   Past Medical History  Diagnosis Date  . Broken arm     left  . Genital warts 06/29/2010  . Other urethritis(597.89) 10/02/2009  . HIDRADENITIS SUPPURATIVA 03/23/2010  . ANHEDONIA 11/05/2009  . Dermatitis 01/31/2012  . Bronchitis 01/31/2012  . Dehydration 03/30/2012  . Shoulder pain, right 03/30/2012  . Asthma   . Penile rash 04/2013    Dermatitis: nummular eczema vs Lichen planus (cutivate rx'd by derm)    Past Surgical History  Procedure Laterality Date  . Treatment fistula anal      Family History  Problem Relation Age of Onset  . Cancer Maternal Grandfather     lung/ Smoker  . Alcohol abuse Paternal Grandmother     History   Social History  . Marital Status: Single    Spouse Name: N/A    Number of Children: N/A  . Years of Education: N/A   Occupational History  . Not on file.   Social History Main Topics  . Smoking status: Former Smoker    Types: Cigarettes    Quit date: 08/25/2009  . Smokeless tobacco: Never Used  . Alcohol Use: 0.0 oz/week    0 drink(s) per week     Comment: occasional  . Drug Use: No     Comment: marijuana  . Sexual Activity: Not Currently    Partners: Female   Other Topics Concern  . Not on file   Social History Narrative  . No narrative on file    No current outpatient prescriptions on file prior to visit.   No current  facility-administered medications on file prior to visit.    No Known Allergies  Review of Systems  Review of Systems  Constitutional: Negative for fever and malaise/fatigue.  HENT: Negative for congestion.   Eyes: Negative for discharge.  Respiratory: Negative for shortness of breath.   Cardiovascular: Negative for chest pain, palpitations and leg swelling.  Gastrointestinal: Negative for nausea, abdominal pain and diarrhea.  Genitourinary: Negative for dysuria.  Musculoskeletal: Negative for falls.  Skin: Positive for rash.  Neurological: Negative for loss of consciousness and headaches.  Endo/Heme/Allergies: Negative for polydipsia.  Psychiatric/Behavioral: Negative for depression and suicidal ideas. The patient is not nervous/anxious and does not have insomnia.     Objective  BP 132/74  Pulse 71  Temp(Src) 98.3 F (36.8 C) (Oral)  Ht 6\' 5"  (1.956 m)  Wt 236 lb 1.9 oz (107.103 kg)  BMI 27.99 kg/m2  SpO2 99%  Physical Exam  Physical Exam  Constitutional: He is oriented to person, place, and time and well-developed, well-nourished, and in no distress. No distress.  HENT:  Head: Normocephalic and atraumatic.  Eyes: Conjunctivae are normal.  Neck: Neck supple. No thyromegaly present.  Cardiovascular: Normal rate, regular rhythm and normal heart sounds.   No murmur heard. Pulmonary/Chest: Effort normal and breath sounds normal. No respiratory  distress.  Abdominal: He exhibits no distension and no mass. There is no tenderness.  Musculoskeletal: He exhibits no edema.  Neurological: He is alert and oriented to person, place, and time.  Skin: Skin is warm. Rash noted.  3 small erythematous scaly lesions at edge of glans and 1 on right anterior abdomen. No fluctance or discharge  Psychiatric: Memory, affect and judgment normal.    No results found for this basename: TSH   Lab Results  Component Value Date   WBC 4.4* 10/27/2011   HGB 14.1 10/27/2011   HCT 42.1  10/27/2011   MCV 95.6 10/27/2011   PLT 156.0 10/27/2011   Lab Results  Component Value Date   CREATININE 1.1 10/27/2011   BUN 15 10/27/2011   NA 141 10/27/2011   K 3.5 10/27/2011   CL 108 10/27/2011   CO2 24 10/27/2011   Lab Results  Component Value Date   ALT 11 10/27/2011   AST 19 10/27/2011   ALKPHOS 67 10/27/2011   BILITOT 0.5 10/27/2011     Assessment & Plan  Tinea corporis Right anterior abdomen and penile glans. Started on Lotrisone bid, if no response can try Clobetasol. Return if worsens  OTHER URETHRITIS No discharge or concerns today

## 2013-09-13 ENCOUNTER — Emergency Department (HOSPITAL_COMMUNITY)
Admission: EM | Admit: 2013-09-13 | Discharge: 2013-09-13 | Disposition: A | Discharge status: Home / Self Care | Payer: 59 | Attending: Emergency Medicine | Admitting: Emergency Medicine

## 2013-09-13 ENCOUNTER — Encounter (HOSPITAL_COMMUNITY): Payer: Self-pay | Admitting: Emergency Medicine

## 2013-09-13 DIAGNOSIS — Z8639 Personal history of other endocrine, nutritional and metabolic disease: Secondary | ICD-10-CM | POA: Insufficient documentation

## 2013-09-13 DIAGNOSIS — Z8619 Personal history of other infectious and parasitic diseases: Secondary | ICD-10-CM | POA: Insufficient documentation

## 2013-09-13 DIAGNOSIS — Z87448 Personal history of other diseases of urinary system: Secondary | ICD-10-CM | POA: Insufficient documentation

## 2013-09-13 DIAGNOSIS — Z87891 Personal history of nicotine dependence: Secondary | ICD-10-CM | POA: Insufficient documentation

## 2013-09-13 DIAGNOSIS — Z872 Personal history of diseases of the skin and subcutaneous tissue: Secondary | ICD-10-CM | POA: Insufficient documentation

## 2013-09-13 DIAGNOSIS — J45909 Unspecified asthma, uncomplicated: Secondary | ICD-10-CM | POA: Insufficient documentation

## 2013-09-13 DIAGNOSIS — Z862 Personal history of diseases of the blood and blood-forming organs and certain disorders involving the immune mechanism: Secondary | ICD-10-CM | POA: Insufficient documentation

## 2013-09-13 DIAGNOSIS — Z113 Encounter for screening for infections with a predominantly sexual mode of transmission: Secondary | ICD-10-CM | POA: Insufficient documentation

## 2013-09-13 NOTE — ED Notes (Signed)
Pt st's he received a call from a girl that told him she has herpes.  Pt st's they had unprotected sex last night.  Pt denies having any symptoms

## 2013-09-13 NOTE — Discharge Instructions (Signed)
Health Maintenance A healthy lifestyle and preventative care can promote health and wellness.  Maintain regular health, dental, and eye exams.  Eat a healthy diet. Foods like vegetables, fruits, whole grains, low-fat dairy products, and lean protein foods contain the nutrients you need and are low in calories. Decrease your intake of foods high in solid fats, added sugars, and salt. Get information about a proper diet from your health care provider, if necessary.  Regular physical exercise is one of the most important things you can do for your health. Most adults should get at least 150 minutes of moderate-intensity exercise (any activity that increases your heart rate and causes you to sweat) each week. In addition, most adults need muscle-strengthening exercises on 2 or more days a week.   Maintain a healthy weight. The body mass index (BMI) is a screening tool to identify possible weight problems. It provides an estimate of body fat based on height and weight. Your health care provider can find your BMI and can help you achieve or maintain a healthy weight. For males 20 years and older:  A BMI below 18.5 is considered underweight.  A BMI of 18.5 to 24.9 is normal.  A BMI of 25 to 29.9 is considered overweight.  A BMI of 30 and above is considered obese.  Maintain normal blood lipids and cholesterol by exercising and minimizing your intake of saturated fat. Eat a balanced diet with plenty of fruits and vegetables. Blood tests for lipids and cholesterol should begin at age 26 and be repeated every 5 years. If your lipid or cholesterol levels are high, you are over age 35, or you are at high risk for heart disease, you may need your cholesterol levels checked more frequently.Ongoing high lipid and cholesterol levels should be treated with medicines if diet and exercise are not working.  If you smoke, find out from your health care provider how to quit. If you do not use tobacco, do not  start.  Lung cancer screening is recommended for adults aged 56-80 years who are at high risk for developing lung cancer because of a history of smoking. A yearly low-dose CT scan of the lungs is recommended for people who have at least a 30-pack-year history of smoking and are current smokers or have quit within the past 15 years. A pack year of smoking is smoking an average of 1 pack of cigarettes a day for 1 year (for example, a 30-pack-year history of smoking could mean smoking 1 pack a day for 30 years or 2 packs a day for 15 years). Yearly screening should continue until the smoker has stopped smoking for at least 15 years. Yearly screening should be stopped for people who develop a health problem that would prevent them from having lung cancer treatment.  If you choose to drink alcohol, do not have more than 2 drinks per day. One drink is considered to be 12 oz (360 mL) of beer, 5 oz (150 mL) of wine, or 1.5 oz (45 mL) of liquor.  Avoid the use of street drugs. Do not share needles with anyone. Ask for help if you need support or instructions about stopping the use of drugs.  High blood pressure causes heart disease and increases the risk of stroke. Blood pressure should be checked at least every 1-2 years. Ongoing high blood pressure should be treated with medicines if weight loss and exercise are not effective.  If you are 70-20 years old, ask your health care provider if  you should take aspirin to prevent heart disease.  Diabetes screening involves taking a blood sample to check your fasting blood sugar level. This should be done once every 3 years after age 66 if you are at a normal weight and without risk factors for diabetes. Testing should be considered at a younger age or be carried out more frequently if you are overweight and have at least 1 risk factor for diabetes.  Colorectal cancer can be detected and often prevented. Most routine colorectal cancer screening begins at the age of 39  and continues through age 69. However, your health care provider may recommend screening at an earlier age if you have risk factors for colon cancer. On a yearly basis, your health care provider may provide home test kits to check for hidden blood in the stool. A small camera at the end of a tube may be used to directly examine the colon (sigmoidoscopy or colonoscopy) to detect the earliest forms of colorectal cancer. Talk to your health care provider about this at age 36 when routine screening begins. A direct exam of the colon should be repeated every 5-10 years through age 55, unless early forms of precancerous polyps or small growths are found.  People who are at an increased risk for hepatitis B should be screened for this virus. You are considered at high risk for hepatitis B if:  You were born in a country where hepatitis B occurs often. Talk with your health care provider about which countries are considered high risk.  Your parents were born in a high-risk country and you have not received a shot to protect against hepatitis B (hepatitis B vaccine).  You have HIV or AIDS.  You use needles to inject street drugs.  You live with, or have sex with, someone who has hepatitis B.  You are a man who has sex with other men (MSM).  You get hemodialysis treatment.  You take certain medicines for conditions like cancer, organ transplantation, and autoimmune conditions.  Hepatitis C blood testing is recommended for all people born from [birth year] through 1966 and any individual with known risk factors for hepatitis C.  Healthy men should no longer receive prostate-specific antigen (PSA) blood tests as part of routine cancer screening. Talk to your health care provider about prostate cancer screening.  Testicular cancer screening is not recommended for adolescents or adult males who have no symptoms. Screening includes self-exam, a health care provider exam, and other screening tests. Consult with your  health care provider about any symptoms you have or any concerns you have about testicular cancer.  Practice safe sex. Use condoms and avoid high-risk sexual practices to reduce the spread of sexually transmitted infections (STIs).  You should be screened for STIs, including gonorrhea and chlamydia if:  You are sexually active and are younger than 24 years.  You are older than 24 years, and your health care provider tells you that you are at risk for this type of infection.  Your sexual activity has changed since you were last screened, and you are at an increased risk for chlamydia or gonorrhea. Ask your health care provider if you are at risk.  If you are at risk of being infected with HIV, it is recommended that you take a prescription medicine daily to prevent HIV infection. This is called pre-exposure prophylaxis (PrEP). You are considered at risk if:  You are a man who has sex with other men (MSM).  You are a heterosexual man who  is sexually active with multiple partners.  You take drugs by injection.  You are sexually active with a partner who has HIV.  Talk with your health care provider about whether you are at high risk of being infected with HIV. If you choose to begin PrEP, you should first be tested for HIV. You should then be tested every 3 months for as long as you are taking PrEP.  Use sunscreen. Apply sunscreen liberally and repeatedly throughout the day. You should seek shade when your shadow is shorter than you. Protect yourself by wearing long sleeves, pants, a wide-brimmed hat, and sunglasses year round whenever you are outdoors.  Tell your health care provider of new moles or changes in moles, especially if there is a change in shape or color. Also, tell your health care provider if a mole is larger than the size of a pencil eraser.  A one-time screening for abdominal aortic aneurysm (AAA) and surgical repair of large AAAs by ultrasound is recommended for men aged  65-75 years who are current or former smokers.  Stay current with your vaccines (immunizations). Document Released: 08/13/2007 Document Revised: 02/19/2013 Document Reviewed: 07/12/2010 [facility] Patient Information 2015 [place], [place]. This information is not intended to replace advice given to you by your health care provider. Make sure you discuss any questions you have with your health care provider.    Sexually Transmitted Disease A sexually transmitted disease (STD) is a disease or infection that may be passed (transmitted) from person to person, usually during sexual activity. This may happen by way of saliva, semen, blood, vaginal mucus, or urine. Common STDs include:   Gonorrhea.   Chlamydia.   Syphilis.   HIV and AIDS.   Genital herpes.   Hepatitis B and C.   Trichomonas.   Human papillomavirus (HPV).   Pubic lice.   Scabies.  Mites.  Bacterial vaginosis. WHAT ARE CAUSES OF STDs? An STD may be caused by bacteria, a virus, or parasites. STDs are often transmitted during sexual activity if one person is infected. However, they may also be transmitted through nonsexual means. STDs may be transmitted after:   Sexual intercourse with an infected person.   Sharing sex toys with an infected person.   Sharing needles with an infected person or using unclean piercing or tattoo needles.  Having intimate contact with the genitals, mouth, or rectal areas of an infected person.   Exposure to infected fluids during birth. WHAT ARE THE SIGNS AND SYMPTOMS OF STDs? Different STDs have different symptoms. Some people may not have any symptoms. If symptoms are present, they may include:   Painful or bloody urination.   Pain in the pelvis, abdomen, vagina, anus, throat, or eyes.   A skin rash, itching, or irritation.  Growths, ulcerations, blisters, or sores in the genital and anal areas.  Abnormal vaginal discharge with or without bad odor.   Penile  discharge in men.   Fever.   Pain or bleeding during sexual intercourse.   Swollen glands in the groin area.   Yellow skin and eyes (jaundice). This is seen with hepatitis.   Swollen testicles.  Infertility.  Sores and blisters in the mouth. HOW ARE STDs DIAGNOSED? To make a diagnosis, your health care provider may:   Take a medical history.   Perform a physical exam.   Take a sample of any discharge to examine.  Swab the throat, cervix, opening to the penis, rectum, or vagina for testing.  Test a sample of your  first morning urine.   Perform blood tests.   Perform a Pap test, if this applies.   Perform a colposcopy.   Perform a laparoscopy.  HOW ARE STDs TREATED? Treatment depends on the STD. Some STDs may be treated but not cured.   Chlamydia, gonorrhea, trichomonas, and syphilis can be cured with antibiotic medicine.   Genital herpes, hepatitis, and HIV can be treated, but not cured, with prescribed medicines. The medicines lessen symptoms.   Genital warts from HPV can be treated with medicine or by freezing, burning (electrocautery), or surgery. Warts may come back.   HPV cannot be cured with medicine or surgery. However, abnormal areas may be removed from the cervix, vagina, or vulva.   If your diagnosis is confirmed, your recent sexual partners need treatment. This is true even if they are symptom-free or have a negative culture or evaluation. They should not have sex until their health care providers say it is okay. HOW CAN I REDUCE MY RISK OF GETTING AN STD? Take these steps to reduce your risk of getting an STD:  Use latex condoms, dental dams, and water-soluble lubricants during sexual activity. Do not use petroleum jelly or oils.  Avoid having multiple sex partners.  Do not have sex with someone who has other sex partners.  Do not have sex with anyone you do not know or who is at high risk for an STD.  Avoid risky sex practices  that can break your skin.  Do not have sex if you have open sores on your mouth or skin.  Avoid drinking too much alcohol or taking illegal drugs. Alcohol and drugs can affect your judgment and put you in a vulnerable position.  Avoid engaging in oral and anal sex acts.  Get vaccinated for HPV and hepatitis. If you have not received these vaccines in the past, talk to your health care provider about whether one or both might be right for you.   If you are at risk of being infected with HIV, it is recommended that you take a prescription medicine daily to prevent HIV infection. This is called pre-exposure prophylaxis (PrEP). You are considered at risk if:  You are a man who has sex with other men (MSM).  You are a heterosexual man or woman and are sexually active with more than one partner.  You take drugs by injection.  You are sexually active with a partner who has HIV.  Talk with your health care provider about whether you are at high risk of being infected with HIV. If you choose to begin PrEP, you should first be tested for HIV. You should then be tested every 3 months for as long as you are taking PrEP.  WHAT SHOULD I DO IF I THINK I HAVE AN STD?  See your health care provider.   Tell your sexual partner(s). They should be tested and treated for any STDs.  Do not have sex until your health care provider says it is okay. WHEN SHOULD I GET IMMEDIATE MEDICAL CARE? Contact your health care provider right away if:   You have severe abdominal pain.  You are a man and notice swelling or pain in your testicles.  You are a woman and notice swelling or pain in your vagina. Document Released: 05/07/2002 Document Revised: 02/19/2013 Document Reviewed: 09/04/2012 [facility] Patient Information 2015 [place], [place]. This information is not intended to replace advice given to you by your health care provider. Make sure you discuss any questions you have  with your health care  provider.

## 2013-09-13 NOTE — ED Provider Notes (Signed)
CSN: [id number]     Arrival date & time 09/13/13  2037 History   This chart was scribed for a non-physician practitioner, Dierdre ForthHannah Geraldyn Shain, PA-C, working with Gregor HamsElliot L Wentz, MD by Julian HyMorgan Graham, ED Scribe. [facility] The patient's care was started at 9:47 PM.   Chief Complaint  Patient presents with  . Exposure to STD   Patient is a 23 y.o. male presenting with STD exposure. The history is provided by the patient and medical records. No language interpreter was used.  Exposure to STD This is a new problem. The current episode started yesterday. The problem occurs rarely. The problem has not changed since onset.Pertinent negatives include no chest pain, no abdominal pain, no headaches and no shortness of breath.   HPI Comments: Travis Burns is a 23 y.o. male who presents to the Emergency Department complaining of STD exposure. Pt states he received a phone call stating that his girlfriend, that he had unprotected sex with last night has herpes. Pt states his girlfriend got tested 3 weeks ago and the Health Department informed her of her status yesterday. Pt states he was last tested in May 2015. Pt denies penile discharge, abdominal pain, penile lesions, dysuria, fever, chills, nausea and vomiting.  Pt reports he has never had any penile lesions, but needs a test to show his girlfriend.    Past Medical History  Diagnosis Date  . Broken arm     left  . Genital warts 06/29/2010  . Other urethritis(597.89) 10/02/2009  . HIDRADENITIS SUPPURATIVA 03/23/2010  . ANHEDONIA 11/05/2009  . Dermatitis 01/31/2012  . Bronchitis 01/31/2012  . Dehydration 03/30/2012  . Shoulder pain, right 03/30/2012  . Asthma   . Penile rash 04/2013    Dermatitis: nummular eczema vs Lichen planus (cutivate rx'd by derm)   Past Surgical History  Procedure Laterality Date  . Treatment fistula anal     Family History  Problem Relation Age of Onset  . Cancer Maternal Grandfather     lung/ Smoker  .  Alcohol abuse Paternal Grandmother    History  Substance Use Topics  . Smoking status: Former Smoker    Types: Cigarettes    Quit date: 08/25/2009  . Smokeless tobacco: Never Used  . Alcohol Use: 0.0 oz/week    0 drink(s) per week     Comment: occasional    Review of Systems  Constitutional: Negative for fever, chills, diaphoresis, appetite change, fatigue and unexpected weight change.  HENT: Negative for mouth sores.   Eyes: Negative for visual disturbance.  Respiratory: Negative for cough, chest tightness, shortness of breath and wheezing.   Cardiovascular: Negative for chest pain.  Gastrointestinal: Negative for nausea, vomiting, abdominal pain, diarrhea and constipation.  Endocrine: Negative for polydipsia, polyphagia and polyuria.  Genitourinary: Negative for dysuria, urgency, frequency, hematuria, discharge, penile swelling, genital sores and penile pain.  Musculoskeletal: Negative for back pain and neck stiffness.  Skin: Negative for rash.  Allergic/Immunologic: Negative for immunocompromised state.  Neurological: Negative for syncope, light-headedness and headaches.  Hematological: Does not bruise/bleed easily.  Psychiatric/Behavioral: Negative for sleep disturbance. The patient is not nervous/anxious.       Allergies  Review of patient's allergies indicates no known allergies.  Home Medications   Prior to Admission medications   Not on File   Triage Vitals: BP 141/90  Pulse 106  Temp(Src) 98.4 F (36.9 C) (Oral)  Resp 16  Ht 6\' 5"  (1.956 m)  Wt 235 lb 4 oz (106.709 kg)  BMI 27.89 kg/m2  SpO2 100% Physical Exam  Nursing note and vitals reviewed. Constitutional: He appears well-developed and well-nourished. No distress.  Awake, alert, nontoxic appearance  HENT:  Head: Normocephalic and atraumatic.  Mouth/Throat: Oropharynx is clear and moist. No oropharyngeal exudate.  Eyes: Conjunctivae are normal. No scleral icterus.  Neck: Normal range of motion.  Neck supple.  Cardiovascular: Normal rate, regular rhythm, normal heart sounds and intact distal pulses.   No murmur heard. Pulmonary/Chest: Effort normal and breath sounds normal. No respiratory distress. He has no wheezes.  Abdominal: Soft. Bowel sounds are normal. He exhibits no mass. There is no tenderness. There is no rebound and no guarding. Hernia confirmed negative in the right inguinal area and confirmed negative in the left inguinal area.  Genitourinary: Testes normal and penis normal. Circumcised.  Musculoskeletal: Normal range of motion. He exhibits no edema.  Lymphadenopathy:       Right: No inguinal adenopathy present.       Left: No inguinal adenopathy present.  Neurological: He is alert.  Speech is clear and goal oriented Moves extremities without ataxia  Skin: Skin is warm and dry. He is not diaphoretic.  Psychiatric: He has a normal mood and affect.    ED Course  Procedures (including critical care time) DIAGNOSTIC STUDIES: Oxygen Saturation is 100% on RA, normal by my interpretation.    COORDINATION OF CARE: 10:07 PM- Patient informed of current plan for treatment and evaluation and agrees with plan at this time.  Labs Review Labs Reviewed  HSV 1 ANTIBODY, IGG  HSV 2 ANTIBODY, IGG      MDM   Final diagnoses:  Screen for STD (sexually transmitted disease)   [name] presents for std screen and is asymptomatic.  Pt reports exposure to Herpes last night.  Pt denies any hx of penile lesions.  Will draw blood for HSV I & II.     I have personally reviewed patient's vitals, nursing note and any pertinent labs or imaging.  I performed an undressed physical exam.    At this time, it has been determined that no acute conditions requiring further emergency intervention. The patient/guardian have been advised of the diagnosis and plan. I reviewed all labs and imaging including any potential incidental findings. We have discussed signs and symptoms that warrant  return to the ED, such as new lesions, fever, or other concerning.  Patient/guardian has voiced understanding and agreed to follow-up with the PCP or specialist in 1 week.  Vital signs are stable at discharge.   BP 141/90  Pulse 106  Temp(Src) 98.4 F (36.9 C) (Oral)  Resp 16  Ht 6\' 5"  (1.956 m)  Wt 235 lb 4 oz (106.709 kg)  BMI 27.89 kg/m2  SpO2 100%    I personally performed the services described in this documentation, which was scribed in my presence. The recorded information has been reviewed and is accurate.     Dahlia Client Tejal Monroy, PA-C 09/13/13 2207

## 2013-09-14 NOTE — ED Provider Notes (Signed)
Medical screening examination/treatment/procedure(s) were performed by non-physician practitioner and as supervising physician I was immediately available for consultation/collaboration.  Flint MelterElliott L Ladasha Schnackenberg, MD 09/14/13 [phone]

## 2013-09-16 LAB — HSV 1 ANTIBODY, IGG

## 2013-09-16 LAB — HSV 2 ANTIBODY, IGG: HSV 2 GLYCOPROTEIN G AB, IGG: 6.5 IV — AB

## 2014-03-03 ENCOUNTER — Emergency Department (HOSPITAL_COMMUNITY): Payer: 59

## 2014-03-03 ENCOUNTER — Encounter (HOSPITAL_COMMUNITY): Payer: Self-pay | Admitting: Neurology

## 2014-03-03 ENCOUNTER — Emergency Department (HOSPITAL_COMMUNITY)
Admission: EM | Admit: 2014-03-03 | Discharge: 2014-03-03 | Disposition: A | Discharge status: Home / Self Care | Payer: 59 | Attending: Emergency Medicine | Admitting: Emergency Medicine

## 2014-03-03 DIAGNOSIS — J45909 Unspecified asthma, uncomplicated: Secondary | ICD-10-CM | POA: Insufficient documentation

## 2014-03-03 DIAGNOSIS — Z87891 Personal history of nicotine dependence: Secondary | ICD-10-CM | POA: Diagnosis not present

## 2014-03-03 DIAGNOSIS — Y9389 Activity, other specified: Secondary | ICD-10-CM | POA: Diagnosis not present

## 2014-03-03 DIAGNOSIS — Y92410 Unspecified street and highway as the place of occurrence of the external cause: Secondary | ICD-10-CM | POA: Diagnosis not present

## 2014-03-03 DIAGNOSIS — S199XXA Unspecified injury of neck, initial encounter: Secondary | ICD-10-CM | POA: Diagnosis not present

## 2014-03-03 DIAGNOSIS — S060X9A Concussion with loss of consciousness of unspecified duration, initial encounter: Secondary | ICD-10-CM | POA: Diagnosis not present

## 2014-03-03 DIAGNOSIS — Z8619 Personal history of other infectious and parasitic diseases: Secondary | ICD-10-CM | POA: Insufficient documentation

## 2014-03-03 DIAGNOSIS — Z8781 Personal history of (healed) traumatic fracture: Secondary | ICD-10-CM | POA: Insufficient documentation

## 2014-03-03 DIAGNOSIS — Y998 Other external cause status: Secondary | ICD-10-CM | POA: Insufficient documentation

## 2014-03-03 DIAGNOSIS — Z8639 Personal history of other endocrine, nutritional and metabolic disease: Secondary | ICD-10-CM | POA: Diagnosis not present

## 2014-03-03 DIAGNOSIS — Z87438 Personal history of other diseases of male genital organs: Secondary | ICD-10-CM | POA: Insufficient documentation

## 2014-03-03 DIAGNOSIS — S0990XA Unspecified injury of head, initial encounter: Secondary | ICD-10-CM | POA: Diagnosis present

## 2014-03-03 DIAGNOSIS — Z872 Personal history of diseases of the skin and subcutaneous tissue: Secondary | ICD-10-CM | POA: Diagnosis not present

## 2014-03-03 DIAGNOSIS — T1490XA Injury, unspecified, initial encounter: Secondary | ICD-10-CM

## 2014-03-03 LAB — PROTIME-INR
INR: 1.01 (ref 0.00–1.49)
Prothrombin Time: 13.4 seconds (ref 11.6–15.2)

## 2014-03-03 LAB — COMPREHENSIVE METABOLIC PANEL
ALK PHOS: 72 U/L (ref 39–117)
ALT: 26 U/L (ref 0–53)
AST: 23 U/L (ref 0–37)
Albumin: 4.1 g/dL (ref 3.5–5.2)
Anion gap: 7 (ref 5–15)
BUN: 14 mg/dL (ref 6–23)
CHLORIDE: 106 meq/L (ref 96–112)
CO2: 26 mmol/L (ref 19–32)
CREATININE: 1.13 mg/dL (ref 0.50–1.35)
Calcium: 9.3 mg/dL (ref 8.4–10.5)
GFR, EST NON AFRICAN AMERICAN: 90 mL/min — AB (ref >90)
Glucose, Bld: 101 mg/dL — ABNORMAL HIGH (ref 70–99)
Potassium: 4.3 mmol/L (ref 3.5–5.1)
Sodium: 139 mmol/L (ref 135–145)
Total Bilirubin: 0.6 mg/dL (ref 0.3–1.2)
Total Protein: 7.3 g/dL (ref 6.0–8.3)

## 2014-03-03 LAB — CBC
HCT: 45.2 % (ref 39.0–52.0)
Hemoglobin: 15.4 g/dL (ref 13.0–17.0)
MCH: 32.6 pg (ref 26.0–34.0)
MCHC: 34.1 g/dL (ref 30.0–36.0)
MCV: 95.6 fL (ref 78.0–100.0)
Platelets: 143 10*3/uL — ABNORMAL LOW (ref 150–400)
RBC: 4.73 MIL/uL (ref 4.22–5.81)
RDW: 12.5 % (ref 11.5–15.5)
WBC: 4.3 10*3/uL (ref 4.0–10.5)

## 2014-03-03 LAB — ETHANOL: Alcohol, Ethyl (B): <5 mg/dL (ref 0–9)

## 2014-03-03 MED ORDER — ACETAMINOPHEN 325 MG PO TABS
650.0000 mg | ORAL_TABLET | Freq: Once | ORAL | Status: AC
  Administered 2014-03-03: 650 mg via ORAL
  Filled 2014-03-03: qty 2
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
## 2014-03-03 NOTE — ED Notes (Signed)
Patient transported to CT 
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
## 2014-03-03 NOTE — ED Notes (Signed)
Pt able to get up from bed, standing and ambulate in room independently. Pt tolerated well. MD made aware.
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
## 2014-09-08 ENCOUNTER — Emergency Department (HOSPITAL_COMMUNITY)
Admission: EM | Admit: 2014-09-08 | Discharge: 2014-09-08 | Disposition: A | Discharge status: Home / Self Care | Payer: 59 | Attending: Emergency Medicine | Admitting: Emergency Medicine

## 2014-09-08 ENCOUNTER — Encounter (HOSPITAL_COMMUNITY): Payer: Self-pay | Admitting: Emergency Medicine

## 2014-09-08 ENCOUNTER — Emergency Department (HOSPITAL_COMMUNITY): Payer: 59

## 2014-09-08 DIAGNOSIS — Z87891 Personal history of nicotine dependence: Secondary | ICD-10-CM | POA: Insufficient documentation

## 2014-09-08 DIAGNOSIS — Y9389 Activity, other specified: Secondary | ICD-10-CM | POA: Diagnosis not present

## 2014-09-08 DIAGNOSIS — Z8619 Personal history of other infectious and parasitic diseases: Secondary | ICD-10-CM | POA: Insufficient documentation

## 2014-09-08 DIAGNOSIS — Y92410 Unspecified street and highway as the place of occurrence of the external cause: Secondary | ICD-10-CM | POA: Diagnosis not present

## 2014-09-08 DIAGNOSIS — S161XXA Strain of muscle, fascia and tendon at neck level, initial encounter: Secondary | ICD-10-CM | POA: Diagnosis not present

## 2014-09-08 DIAGNOSIS — Z8639 Personal history of other endocrine, nutritional and metabolic disease: Secondary | ICD-10-CM | POA: Insufficient documentation

## 2014-09-08 DIAGNOSIS — Z87438 Personal history of other diseases of male genital organs: Secondary | ICD-10-CM | POA: Diagnosis not present

## 2014-09-08 DIAGNOSIS — Z23 Encounter for immunization: Secondary | ICD-10-CM | POA: Insufficient documentation

## 2014-09-08 DIAGNOSIS — S20312A Abrasion of left front wall of thorax, initial encounter: Secondary | ICD-10-CM | POA: Insufficient documentation

## 2014-09-08 DIAGNOSIS — R52 Pain, unspecified: Secondary | ICD-10-CM

## 2014-09-08 DIAGNOSIS — Z8719 Personal history of other diseases of the digestive system: Secondary | ICD-10-CM | POA: Diagnosis not present

## 2014-09-08 DIAGNOSIS — J45909 Unspecified asthma, uncomplicated: Secondary | ICD-10-CM | POA: Insufficient documentation

## 2014-09-08 DIAGNOSIS — S199XXA Unspecified injury of neck, initial encounter: Secondary | ICD-10-CM | POA: Diagnosis present

## 2014-09-08 DIAGNOSIS — Y998 Other external cause status: Secondary | ICD-10-CM | POA: Diagnosis not present

## 2014-09-08 MED ORDER — HYDROCODONE-ACETAMINOPHEN 5-325 MG PO TABS
2.0000 | ORAL_TABLET | Freq: Once | ORAL | Status: AC
  Administered 2014-09-08: 2 via ORAL
  Filled 2014-09-08: qty 2

## 2014-09-08 MED ORDER — IBUPROFEN 800 MG PO TABS
800.0000 mg | ORAL_TABLET | Freq: Three times a day (TID) | ORAL | Status: DC
Start: 2014-09-08 — End: 2016-07-26

## 2014-09-08 MED ORDER — HYDROCODONE-ACETAMINOPHEN 5-325 MG PO TABS: 2.0000 | ORAL_TABLET | ORAL | Status: DC | PRN

## 2014-09-08 MED ORDER — TETANUS-DIPHTH-ACELL PERTUSSIS 5-2.5-18.5 LF-MCG/0.5 IM SUSP
0.5000 mL | Freq: Once | INTRAMUSCULAR | Status: AC
  Administered 2014-09-08: 0.5 mL via INTRAMUSCULAR
  Filled 2014-09-08: qty 0.5
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
## 2014-09-08 NOTE — ED Notes (Signed)
Family at bedside. 

## 2014-09-08 NOTE — ED Notes (Signed)
Pt. Had a neck collar and pulled it off, stated it hurt too much.
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
## 2014-09-08 NOTE — ED Notes (Signed)
Declined W/C at D/C and was escorted to lobby by RN. 

## 2015-04-14 ENCOUNTER — Ambulatory Visit: Payer: 59 | Admitting: Medical

## 2015-04-14 ENCOUNTER — Telehealth: Payer: Self-pay | Admitting: Family Medicine

## 2015-04-14 ENCOUNTER — Encounter: Payer: 59 | Admitting: Medical

## 2015-04-14 NOTE — Telephone Encounter (Signed)
No Charge per provider. He can reschedule Friday.

## 2015-04-14 NOTE — Progress Notes (Signed)
This encounter was created in error - please disregard.

## 2015-04-14 NOTE — Telephone Encounter (Signed)
Pt called to reschedule appt from 2/14 9:30am (called 9:33am). Pt states he was on the way but was going to be late. Asked if he could be here within 12 minutes and he stated no he went back. Pt wanted to reschedule for 2/17 when he is off work. Advised pt he may be charged for no show. Charge or no charge?

## 2015-04-17 ENCOUNTER — Encounter: Payer: 59 | Admitting: Medical

## 2015-04-17 NOTE — Progress Notes (Signed)
This encounter was created in error - please disregard.

## 2016-07-26 ENCOUNTER — Encounter (HOSPITAL_COMMUNITY): Payer: Self-pay | Admitting: Emergency Medicine

## 2016-07-26 ENCOUNTER — Ambulatory Visit (HOSPITAL_COMMUNITY)
Admission: EM | Admit: 2016-07-26 | Discharge: 2016-07-26 | Disposition: A | Discharge status: Home / Self Care | Payer: 59 | Attending: Family Medicine | Admitting: Family Medicine

## 2016-07-26 DIAGNOSIS — J069 Acute upper respiratory infection, unspecified: Secondary | ICD-10-CM | POA: Diagnosis not present

## 2016-07-26 MED ORDER — BENZONATATE 100 MG PO CAPS: 100.0000 mg | ORAL_CAPSULE | Freq: Three times a day (TID) | ORAL | 0 refills | Status: DC

## 2016-07-26 NOTE — Discharge Instructions (Signed)
You most likely have a viral URI, I advise rest, plenty of fluids and management of symptoms with over the counter medicines. For symptoms you may take Tylenol as needed every 4-6 hours for body aches or fever, not to exceed 4,000 mg a day, Take mucinex or mucinex DM ever 12 hours with a full glass of water, you may use an inhaled steroid such as Flonase, 2 sprays each nostril once a day for congestion, or an antihistamine such as Claritin or Zyrtec once a day. For cough, I have prescribed a medication called Tessalon. Take 1 tablet every 8 hours as needed for your cough. Should your symptoms worsen or fail to resolve, follow up with your primary care provider or return to clinic.  °

## 2016-07-26 NOTE — ED Triage Notes (Signed)
[facility] a [facility], right ear pain and a headache x 4 days.

## 2016-07-26 NOTE — ED Provider Notes (Signed)
CSN: [id number]     Arrival date & time 07/26/16  1001 History   First MD Initiated Contact with Patient 07/26/16 1023     Chief Complaint  Patient presents with  . Sore Throat  . Otalgia   (Consider location/radiation/quality/duration/timing/severity/associated sxs/prior Treatment) 26 year old male presents to clinic with chief complaint of congestion, sore throat, no otalgia, ongoing for 4 days.   The history is provided by the patient.  URI  Presenting symptoms: congestion, cough, ear pain, fatigue, rhinorrhea and sore throat   Presenting symptoms: no facial pain and no fever   Congestion:    Location:  Nasal   Interferes with sleep: no     Interferes with eating/drinking: no   Severity:  Moderate Onset quality:  Gradual Duration:  4 days Timing:  Constant Progression:  Unchanged Chronicity:  New Relieved by:  Decongestant and OTC medications Worsened by:  Nothing Associated symptoms: headaches   Associated symptoms: no arthralgias, no myalgias, no neck pain, no sinus pain, no sneezing, no swollen glands and no wheezing     Past Medical History:  Diagnosis Date  . ANHEDONIA 11/05/2009  . Asthma   . Broken arm    left  . Bronchitis 01/31/2012  . Dehydration 03/30/2012  . Dermatitis 01/31/2012  . Genital warts 06/29/2010  . HIDRADENITIS SUPPURATIVA 03/23/2010  . Other urethritis(597.89) 10/02/2009  . Penile rash 04/2013   Dermatitis: nummular eczema vs Lichen planus (cutivate rx'd by derm)  . Shoulder pain, right 03/30/2012   Past Surgical History:  Procedure Laterality Date  . TREATMENT FISTULA ANAL     Family History  Problem Relation Age of Onset  . Cancer Maternal Grandfather        lung/ Smoker  . Alcohol abuse Paternal Grandmother    Social History  Substance Use Topics  . Smoking status: Former Smoker    Types: Cigarettes    Quit date: 08/25/2009  . Smokeless tobacco: Never Used  . Alcohol use 0.0 oz/week    0 drink(s) per week     Comment: occasional     Review of Systems  Constitutional: Positive for fatigue. Negative for chills and fever.  HENT: Positive for congestion, ear pain, rhinorrhea and sore throat. Negative for sinus pain and sneezing.   Respiratory: Positive for cough. Negative for wheezing.   Cardiovascular: Negative for chest pain and palpitations.  Gastrointestinal: Negative.   Musculoskeletal: Negative for arthralgias, myalgias and neck pain.  Skin: Negative.   Neurological: Positive for headaches. Negative for light-headedness.    Allergies  Patient has no known allergies.  Home Medications   Prior to Admission medications   Medication Sig Start Date End Date Taking? Authorizing Provider  benzonatate (TESSALON) 100 MG capsule Take 1 capsule (100 mg total) by mouth every 8 (eight) hours. 07/26/16   Dorena Bodo, NP   Meds Ordered and Administered this Visit  Medications - No data to display  BP 135/86 (BP Location: Right Arm)   Pulse 84   Temp 98.6 F (37 C) (Oral)   Resp 16   SpO2 98%  No data found.   Physical Exam  Constitutional: He is oriented to person, place, and time. He appears well-developed and well-nourished. No distress.  HENT:  Head: Normocephalic and atraumatic.  Right Ear: Tympanic membrane and external ear normal.  Left Ear: Tympanic membrane and external ear normal.  Nose: Mucosal edema and rhinorrhea present. Right sinus exhibits no maxillary sinus tenderness and no frontal sinus tenderness. Left sinus exhibits no maxillary sinus  tenderness and no frontal sinus tenderness.  Mouth/Throat: Uvula is midline and oropharynx is clear and moist. No oropharyngeal exudate.  Eyes: Conjunctivae are normal.  Neck: Normal range of motion. Neck supple. No JVD present.  Cardiovascular: Normal rate and regular rhythm.   Pulmonary/Chest: Effort normal and breath sounds normal.  Abdominal: Soft. Bowel sounds are normal.  Lymphadenopathy:    He has no cervical adenopathy.  Neurological: He is  alert and oriented to person, place, and time.  Skin: Skin is warm and dry. Capillary refill takes less than 2 seconds. No rash noted. He is not diaphoretic. No erythema.  Psychiatric: He has a normal mood and affect. His behavior is normal.  Nursing note and vitals reviewed.   Urgent Care Course     Procedures (including critical care time)  Labs Review Labs Reviewed - No data to display  Imaging Review No results found.   Visual Acuity Review  Right Eye Distance:   Left Eye Distance:   Bilateral Distance:    Right Eye Near:   Left Eye Near:    Bilateral Near:         MDM   1. Viral upper respiratory tract infection    Provided counseling on over-the-counter therapies for symptom relief, given prescription for Tessalon for cough, work note provided, follow-up with primary care, return to clinic in one week as needed.    Dorena BodoKennard, Breann Losano, NP 07/26/16 1036

## 2018-11-27 ENCOUNTER — Telehealth: Payer: Self-pay

## 2018-11-27 NOTE — Telephone Encounter (Signed)
Copied from [place] [phone]. Topic: Appointment Scheduling - Scheduling Inquiry for Clinic >> Nov 27, 2018  1:30 PM Rainey Pines A wrote: Patients Mother Burlie Cajamarca is a patient of Dr. Sarajane Jews and is requesting a new patient appointment for her son. Helene Kelp would like a callback today in regards to Dr. Sarajane Jews taking on her son as a new patient. She can be reached at [phone]

## 2018-11-29 NOTE — Telephone Encounter (Signed)
OK with me.

## 2018-11-29 NOTE — Telephone Encounter (Signed)
Okay to transfer?  Please advise.  

## 2018-11-29 NOTE — Telephone Encounter (Signed)
Yes I can see him  ?

## 2018-11-29 NOTE — Telephone Encounter (Signed)
Noted  

## 2018-12-18 ENCOUNTER — Ambulatory Visit: Payer: 59 | Admitting: Family Medicine

## 2019-01-01 ENCOUNTER — Ambulatory Visit: Payer: 59 | Admitting: Family Medicine

## 2020-03-04 ENCOUNTER — Other Ambulatory Visit: Payer: Self-pay

## 2020-03-04 ENCOUNTER — Telehealth (INDEPENDENT_AMBULATORY_CARE_PROVIDER_SITE_OTHER): Payer: Self-pay | Admitting: Family Medicine

## 2020-03-04 ENCOUNTER — Encounter: Payer: Self-pay | Admitting: Family Medicine

## 2020-03-04 DIAGNOSIS — J029 Acute pharyngitis, unspecified: Secondary | ICD-10-CM

## 2020-03-04 DIAGNOSIS — J069 Acute upper respiratory infection, unspecified: Secondary | ICD-10-CM

## 2020-03-04 MED ORDER — FLUTICASONE PROPIONATE 50 MCG/ACT NA SUSP: 2.0000 | Freq: Every day | NASAL | 6 refills | Status: DC

## 2020-03-04 MED ORDER — AMOXICILLIN-POT CLAVULANATE 875-125 MG PO TABS: 1.0000 | ORAL_TABLET | Freq: Two times a day (BID) | ORAL | 0 refills | Status: DC

## 2020-03-04 MED ORDER — LEVOCETIRIZINE DIHYDROCHLORIDE 5 MG PO TABS: 5.0000 mg | ORAL_TABLET | Freq: Every evening | ORAL | 5 refills | Status: AC

## 2020-03-04 NOTE — Progress Notes (Signed)
Virtual Visit via Video Note--- pt did not have a phone/ computer to do video visit ---has to convert to telephone   I connected with Rosemarie Ax on 03/04/20 at 11:40 AM EST by a video enabled telemedicine application and verified that I am speaking with the correct person using two identifiers.  Location: Patient: home with his children  Provider:  home I discussed the limitations of evaluation and management by telemedicine and the availability of in person appointments. The patient expressed understanding and agreed to proceed.  History of Present Illness: Pt is home c/o sore throat and headache x 3 days    No fever   + congestion in nose and chest   No cough No sob  He has not had the covid vaccine and refuses to get tested    Past Medical History:  Diagnosis Date  . ANHEDONIA 11/05/2009  . Asthma   . Broken arm    left  . Bronchitis 01/31/2012  . Dehydration 03/30/2012  . Dermatitis 01/31/2012  . Genital warts 06/29/2010  . HIDRADENITIS SUPPURATIVA 03/23/2010  . Other urethritis(597.89) 10/02/2009  . Penile rash 04/2013   Dermatitis: nummular eczema vs Lichen planus (cutivate rx'd by derm)  . Shoulder pain, right 03/30/2012   Outpatient Encounter Medications as of 03/04/2020  Medication Sig  . amoxicillin-clavulanate (AUGMENTIN) 875-125 MG tablet Take 1 tablet by mouth 2 (two) times daily.  . fluticasone (FLONASE) 50 MCG/ACT nasal spray Place 2 sprays into both nostrils daily.  Marland Kitchen levocetirizine (XYZAL) 5 MG tablet Take 1 tablet (5 mg total) by mouth every evening.  . [DISCONTINUED] benzonatate (TESSALON) 100 MG capsule Take 1 capsule (100 mg total) by mouth every 8 (eight) hours.   No facility-administered encounter medications on file as of 03/04/2020.   Observations/Objective: No vitals obtained Pt is in nad  No sob   Assessment and Plan: 1. Pharyngitis, unspecified etiology Recommended pt get covid test --- he refused and said he would get if they come out with a better tx for  covid  abx , antihistamine and flonase sent to pharmacy Pt will call back if symptoms do not improve  - amoxicillin-clavulanate (AUGMENTIN) 875-125 MG tablet; Take 1 tablet by mouth 2 (two) times daily.  Dispense: 20 tablet; Refill: 0  2. Upper respiratory tract infection, unspecified type   - fluticasone (FLONASE) 50 MCG/ACT nasal spray; Place 2 sprays into both nostrils daily.  Dispense: 16 g; Refill: 6 - levocetirizine (XYZAL) 5 MG tablet; Take 1 tablet (5 mg total) by mouth every evening.  Dispense: 30 tablet; Refill: 5   Follow Up Instructions:    I discussed the assessment and treatment plan with the patient. The patient was provided an opportunity to ask questions and all were answered. The patient agreed with the plan and demonstrated an understanding of the instructions.   The patient was advised to call back or seek an in-person evaluation if the symptoms worsen or if the condition fails to improve as anticipated.  I provided 25 minutes of non-face-to-face time during this encounter.   Donato Schultz, DO

## 2020-04-22 ENCOUNTER — Other Ambulatory Visit: Payer: Self-pay

## 2020-04-22 ENCOUNTER — Ambulatory Visit (HOSPITAL_COMMUNITY)
Admission: EM | Admit: 2020-04-22 | Discharge: 2020-04-22 | Disposition: A | Discharge status: Home / Self Care | Payer: Worker's Compensation | Attending: Medical Oncology | Admitting: Medical Oncology

## 2020-04-22 ENCOUNTER — Encounter (HOSPITAL_COMMUNITY): Payer: Self-pay | Admitting: Emergency Medicine

## 2020-04-22 DIAGNOSIS — R0981 Nasal congestion: Secondary | ICD-10-CM

## 2020-04-22 MED ORDER — KETOROLAC TROMETHAMINE 30 MG/ML IJ SOLN
INTRAMUSCULAR | Status: AC
  Filled 2020-04-22: qty 1

## 2020-04-22 MED ORDER — FLUTICASONE PROPIONATE 50 MCG/ACT NA SUSP: 2.0000 | Freq: Every day | NASAL | 0 refills | Status: AC

## 2020-04-22 MED ORDER — KETOROLAC TROMETHAMINE 30 MG/ML IJ SOLN
30.0000 mg | Freq: Once | INTRAMUSCULAR | Status: AC
  Administered 2020-04-22: 30 mg via INTRAMUSCULAR

## 2020-04-22 MED ORDER — METHOCARBAMOL 500 MG PO TABS: 500.0000 mg | ORAL_TABLET | Freq: Two times a day (BID) | ORAL | 0 refills | Status: DC

## 2020-04-22 MED ORDER — IBUPROFEN 800 MG PO TABS: 800.0000 mg | ORAL_TABLET | Freq: Three times a day (TID) | ORAL | 0 refills | Status: DC
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
## 2020-11-10 ENCOUNTER — Ambulatory Visit (HOSPITAL_COMMUNITY)
Admission: EM | Admit: 2020-11-10 | Discharge: 2020-11-10 | Disposition: A | Discharge status: Home / Self Care | Payer: Self-pay | Attending: Emergency Medicine | Admitting: Emergency Medicine

## 2020-11-10 ENCOUNTER — Other Ambulatory Visit: Payer: Self-pay

## 2020-11-10 ENCOUNTER — Encounter (HOSPITAL_COMMUNITY): Payer: Self-pay

## 2020-11-10 DIAGNOSIS — S39012A Strain of muscle, fascia and tendon of lower back, initial encounter: Secondary | ICD-10-CM

## 2020-11-10 DIAGNOSIS — S161XXA Strain of muscle, fascia and tendon at neck level, initial encounter: Secondary | ICD-10-CM

## 2020-11-10 MED ORDER — MELOXICAM 7.5 MG PO TABS: 7.5000 mg | ORAL_TABLET | Freq: Every day | ORAL | 0 refills | Status: AC

## 2020-11-10 MED ORDER — METHOCARBAMOL 500 MG PO TABS: 500.0000 mg | ORAL_TABLET | Freq: Two times a day (BID) | ORAL | 0 refills | Status: DC

## 2020-11-10 NOTE — Discharge Instructions (Signed)
You can take the meloxicam daily for your neck, back, and leg pain.  You can also take Tylenol as needed.  Do not take any ibuprofen (advil/motrin) or naproxen (aleve) while taking the meloxicam.   You can use heat, ice, or atlernate between heat and ice for comfort.  You can also use IcyHot, lidocaine patches, biofreeze, Aspercreme, or Voltaren gel as needed for pain relief.   Follow up with orthopedics for further evaluation and management of your neck, back, and leg pain.
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
## 2021-01-16 ENCOUNTER — Ambulatory Visit (HOSPITAL_COMMUNITY)
Admission: EM | Admit: 2021-01-16 | Discharge: 2021-01-16 | Disposition: A | Discharge status: Home / Self Care | Payer: Self-pay | Attending: Emergency Medicine | Admitting: Emergency Medicine

## 2021-01-16 ENCOUNTER — Other Ambulatory Visit: Payer: Self-pay

## 2021-01-16 ENCOUNTER — Encounter (HOSPITAL_COMMUNITY): Payer: Self-pay

## 2021-01-16 DIAGNOSIS — J111 Influenza due to unidentified influenza virus with other respiratory manifestations: Secondary | ICD-10-CM

## 2021-01-16 DIAGNOSIS — H6593 Unspecified nonsuppurative otitis media, bilateral: Secondary | ICD-10-CM

## 2021-01-16 MED ORDER — PREDNISONE 10 MG PO TABS: 20.0000 mg | ORAL_TABLET | Freq: Every day | ORAL | 0 refills | Status: AC

## 2021-01-16 MED ORDER — AMOXICILLIN-POT CLAVULANATE 875-125 MG PO TABS: 1.0000 | ORAL_TABLET | Freq: Two times a day (BID) | ORAL | 0 refills | Status: DC

## 2021-01-16 NOTE — ED Provider Notes (Signed)
[facility]   [id number] 01/16/21 Arrival Time: 1358   Chief Complaint  Patient presents with   Otalgia    + FLU 01/15/2021   Chills     SUBJECTIVE: History from: patient.  Travis Burns is a 30 y.o. male who presented to the urgent care for complaint of being positive for flu on 01/15/2021 and right ear pain for the past 2 to 3 months.  Denies sick exposure to COVID, flu or strep.  Denies recent travel.  Has tried OTC medicatio without relief.  Denies alleviating or aggravating factors.  Denies previous symptoms in the past.   Denies fever, chills, fatigue, sinus pain, rhinorrhea, sore throat, SOB, wheezing, chest pain, nausea, changes in bowel or bladder habits.     ROS: As per HPI.  All other pertinent ROS negative.      Past Medical History:  Diagnosis Date   ANHEDONIA 11/05/2009   Asthma    Broken arm    left   Bronchitis 01/31/2012   Dehydration 03/30/2012   Dermatitis 01/31/2012   Genital warts 06/29/2010   HIDRADENITIS SUPPURATIVA 03/23/2010   Other urethritis(597.89) 10/02/2009   Penile rash 04/2013   Dermatitis: nummular eczema vs Lichen planus (cutivate rx'd by derm)   Shoulder pain, right 03/30/2012   Past Surgical History:  Procedure Laterality Date   TREATMENT FISTULA ANAL     No Known Allergies No current facility-administered medications on file prior to encounter.   Current Outpatient Medications on File Prior to Encounter  Medication Sig Dispense Refill   fluticasone (FLONASE) 50 MCG/ACT nasal spray Place 2 sprays into both nostrils daily. 16 mL 0   levocetirizine (XYZAL) 5 MG tablet Take 1 tablet (5 mg total) by mouth every evening. 30 tablet 5   meloxicam (MOBIC) 7.5 MG tablet Take 1 tablet (7.5 mg total) by mouth daily. 30 tablet 0   methocarbamol (ROBAXIN) 500 MG tablet Take 1 tablet (500 mg total) by mouth 2 (two) times daily. 20 tablet 0   Social History   Socioeconomic History   Marital status: Single    Spouse name: Not on file    Number of children: Not on file   Years of education: Not on file   Highest education level: Not on file  Occupational History   Not on file  Tobacco Use   Smoking status: Former    Types: Cigarettes    Quit date: 08/25/2009    Years since quitting: 11.4   Smokeless tobacco: Never  Substance and Sexual Activity   Alcohol use: Yes    Alcohol/week: 0.0 standard drinks    Comment: occasional   Drug use: No    Types: Marijuana    Comment: marijuana   Sexual activity: Not Currently    Partners: Female  Other Topics Concern   Not on file  Social History Narrative   Not on file   Social Determinants of Health   Financial Resource Strain: Not on file  Food Insecurity: Not on file  Transportation Needs: Not on file  Physical Activity: Not on file  Stress: Not on file  Social Connections: Not on file  Intimate Partner Violence: Not on file   Family History  Problem Relation Age of Onset   Cancer Maternal Grandfather        lung/ Smoker   Alcohol abuse Paternal Grandmother     OBJECTIVE:  Vitals:   01/16/21 1608  BP: 139/85  Pulse: 98  Resp: 18  Temp: 98.4 F (36.9 C)  SpO2: 95%     Physical Exam Vitals and nursing note reviewed.  Constitutional:      General: He is not in acute distress.    Appearance: Normal appearance. He is normal weight. He is not ill-appearing, toxic-appearing or diaphoretic.  HENT:     Right Ear: A middle ear effusion is present.     Left Ear: A middle ear effusion is present. Tympanic membrane is erythematous.  Cardiovascular:     Rate and Rhythm: Normal rate and regular rhythm.     Pulses: Normal pulses.     Heart sounds: Normal heart sounds. No murmur heard.   No friction rub. No gallop.  Pulmonary:     Effort: Pulmonary effort is normal. No respiratory distress.     Breath sounds: No stridor. Wheezing present. No rhonchi or rales.  Chest:     Chest wall: No tenderness.  Neurological:     Mental Status: He is alert and oriented  to person, place, and time.    LABS:  No results found for this or any previous visit (from the past 24 hour(s)).   ASSESSMENT & PLAN:  1. Influenza with respiratory manifestation   2. Fluid level behind tympanic membrane of both ears     Meds ordered this encounter  Medications   amoxicillin-clavulanate (AUGMENTIN) 875-125 MG tablet    Sig: Take 1 tablet by mouth every 12 (twelve) hours.    Dispense:  14 tablet    Refill:  0   predniSONE (DELTASONE) 10 MG tablet    Sig: Take 2 tablets (20 mg total) by mouth daily for 5 days.    Dispense:  10 tablet    Refill:  0    Discharge instructions   Continue OTC Flonase for middle ear effusion Take all medication prescribed by PCP for full Prednisone was prescribed Amoxicillin was prescribed Augmentin Use OTC medications like ibuprofen or tylenol as needed fever or pain Call or go to the ED if you have any new or worsening symptoms such as fever, worsening cough, shortness of breath, chest tightness, chest pain, turning blue, changes in mental status, etc...   Reviewed expectations re: course of current medical issues. Questions answered. Outlined signs and symptoms indicating need for more acute intervention. Patient verbalized understanding. After Visit Summary given.          Durward Parcel, FNP 01/16/21 1713

## 2021-01-16 NOTE — ED Triage Notes (Signed)
Pt reports click sound when swallows in the right ear x 2-3 months. Chills x 2-3 days.  Pt states he tested positive for Flu on 01/15/2021.

## 2021-01-16 NOTE — Discharge Instructions (Addendum)
Continue OTC Flonase for middle ear effusion Take all medication prescribed by PCP for full Prednisone was prescribed Amoxicillin was prescribed Augmentin Use OTC medications like ibuprofen or tylenol as needed fever or pain Call or go to the ED if you have any new or worsening symptoms such as fever, worsening cough, shortness of breath, chest tightness, chest pain, turning blue, changes in mental status, etc.

## 2021-01-28 ENCOUNTER — Encounter: Payer: Self-pay | Admitting: Internal Medicine

## 2021-03-11 ENCOUNTER — Ambulatory Visit (INDEPENDENT_AMBULATORY_CARE_PROVIDER_SITE_OTHER): Payer: Self-pay | Admitting: Student

## 2021-03-11 ENCOUNTER — Encounter: Payer: Self-pay | Admitting: Student

## 2021-03-11 ENCOUNTER — Other Ambulatory Visit: Payer: Self-pay

## 2021-03-11 VITALS — BP 114/80 | HR 94 | Temp 98.7°F | Ht 77.0 in | Wt 192.0 lb

## 2021-03-11 DIAGNOSIS — Z026 Encounter for examination for insurance purposes: Secondary | ICD-10-CM

## 2021-03-11 DIAGNOSIS — S46812A Strain of other muscles, fascia and tendons at shoulder and upper arm level, left arm, initial encounter: Secondary | ICD-10-CM

## 2021-03-11 DIAGNOSIS — S161XXA Strain of muscle, fascia and tendon at neck level, initial encounter: Secondary | ICD-10-CM

## 2021-03-11 MED ORDER — METHOCARBAMOL 500 MG PO TABS: 2000.0000 mg | ORAL_TABLET | Freq: Three times a day (TID) | ORAL | 0 refills | Status: AC

## 2021-03-11 NOTE — Patient Instructions (Addendum)
For your neck pain please take robaxin 2000mg  three times a day for 7 days. In addition take naproxen (aleve) 500mg  TWO times a day with food.   Please avoid driving when taking the robaxin.   You can also use lidocaine patches, TENS unit as well.   Follow up in 2 weeks with .   I will also send a referral to our social worker.

## 2021-03-12 ENCOUNTER — Telehealth: Payer: Self-pay | Admitting: *Deleted

## 2021-03-12 DIAGNOSIS — S46812D Strain of other muscles, fascia and tendons at shoulder and upper arm level, left arm, subsequent encounter: Secondary | ICD-10-CM | POA: Insufficient documentation

## 2021-03-12 DIAGNOSIS — S46812A Strain of other muscles, fascia and tendons at shoulder and upper arm level, left arm, initial encounter: Secondary | ICD-10-CM | POA: Insufficient documentation
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
## 2021-03-12 NOTE — Chronic Care Management (AMB) (Signed)
°  Care Management   Note  03/12/2021 Name: Travis Burns MRN: [id number] DOB: [birth year]  Travis Burns is a 31 y.o. year old male who is a primary care patient of Marolyn Haller, MD. I reached out to [employer] by phone today in response to a referral sent by Travis Burns's primary care provider.   Travis Burns was given information about care management services today including:  Care management services include personalized support from designated clinical staff supervised by his physician, including individualized plan of care and coordination with other care providers 24/7 contact phone numbers for assistance for urgent and routine care needs. The patient may stop care management services at any time by phone call to the office staff.  Patient agreed to services and verbal consent obtained.   Follow up plan: Telephone appointment with care management team member scheduled for:03/16/21  [facility] Guide, Embedded Care Coordination [facility]   Care Management  Direct Dial: [phone]

## 2021-03-12 NOTE — Progress Notes (Signed)
° °  CC: chronic neck and back pain   HPI:  [name] is a 31 y.o. M with PMH below who presents for chronic neck and back pain. Please see problem based charting under encounters tab for further details.    Past Medical History:  Diagnosis Date   ANHEDONIA 11/05/2009   Asthma    Broken arm    left   Bronchitis 01/31/2012   Dehydration 03/30/2012   Dermatitis 01/31/2012   Genital warts 06/29/2010   HIDRADENITIS SUPPURATIVA 03/23/2010   Other urethritis(597.89) 10/02/2009   Penile rash 04/2013   Dermatitis: nummular eczema vs Lichen planus (cutivate rx'd by derm)   Shoulder pain, right 03/30/2012   Review of Systems:  Please see problem based charting under encounters tab for further details.    Physical Exam:  Vitals:   03/11/21 1330  BP: 114/80  Pulse: 94  Temp: 98.7 F (37.1 C)  TempSrc: Oral  SpO2: 99%  Weight: 192 lb (87.1 kg)  Height: 6\' 5"  (1.956 m)   Constitutional: well-developed, well-nourished, and in no distress.  HENT:  Head: Normocephalic and atraumatic.  Eyes: EOM are normal.  Neck: Normal range of motion.  Cardiovascular: Normal rate, regular rhythm, normal heart sounds and intact distal pulses. Exam reveals no gallop and no friction rub.  No murmur heard. Pulmonary/Chest: Effort normal and breath sounds normal. No respiratory distress. He exhibits no tenderness.  Abdominal: Soft. Bowel sounds are normal. He exhibits no distension. There is no abdominal tenderness.  Musculoskeletal: Normal range of motion. L shoulder negative empty can test, nl external rotation strength, pian in lateral aspect of neck with both maneuvers, TTP upper medial aspect of L trapezius muscle. Extension of the neck elicits pain at medial aspect of L trapezius with some numbness down the medial aspect of arm, also elicited with neck rotation.      General: No edema.  Neurological: Non focal Psych: Normal affect  Skin: Skin is warm and dry.    Assessment & Plan:   See Encounters  Tab for problem based charting.  Patient discussed with Dr. 

## 2021-03-16 ENCOUNTER — Telehealth: Payer: Self-pay | Admitting: Licensed Clinical Social Worker

## 2021-03-16 NOTE — Telephone Encounter (Signed)
°  Care Management   Follow Up Note   03/16/2021 Name: Mann Skaggs MRN: [id number] DOB: [birth year]   Referred by: Marolyn Haller, MD Reason for referral : No chief complaint on file.   An unsuccessful telephone outreach was attempted today. The patient was referred to the case management team for assistance with care management and care coordination.   Follow Up Plan: The care management team will reach out to the patient again over the next 7 days.  Christen Butter, BSW  Social Worker [facility] Care Management  [phone]

## 2021-03-17 NOTE — Progress Notes (Signed)
Internal Medicine Clinic Attending   I saw and evaluated the patient.  I personally confirmed the key portions of the history and exam documented by Dr. Carter and I reviewed pertinent patient test results.  The assessment, diagnosis, and plan were formulated together and I agree with the documentation in the resident's note.  

## 2021-03-23 ENCOUNTER — Telehealth: Payer: Self-pay | Admitting: Licensed Clinical Social Worker

## 2021-03-25 ENCOUNTER — Ambulatory Visit (INDEPENDENT_AMBULATORY_CARE_PROVIDER_SITE_OTHER): Payer: Self-pay | Admitting: Student

## 2021-03-25 VITALS — BP 128/81 | HR 95 | Temp 98.5°F | Ht 77.0 in | Wt 190.7 lb

## 2021-03-25 DIAGNOSIS — S46812A Strain of other muscles, fascia and tendons at shoulder and upper arm level, left arm, initial encounter: Secondary | ICD-10-CM

## 2021-03-25 DIAGNOSIS — S46812D Strain of other muscles, fascia and tendons at shoulder and upper arm level, left arm, subsequent encounter: Secondary | ICD-10-CM

## 2021-03-25 NOTE — Patient Instructions (Addendum)
Please take the aleve 500mg  TWICE a day. We will switch your muscle relaxer to flexeril please take this as prescribed.   Please use the TENS unit daily.   Please use heating pad and ice as well.   We will refer you to sports medicine.
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
## 2021-03-29 ENCOUNTER — Encounter: Payer: Self-pay | Admitting: Family Medicine

## 2021-03-29 ENCOUNTER — Ambulatory Visit (INDEPENDENT_AMBULATORY_CARE_PROVIDER_SITE_OTHER): Payer: Self-pay | Admitting: Family Medicine

## 2021-03-29 ENCOUNTER — Other Ambulatory Visit: Payer: Self-pay

## 2021-03-29 VITALS — BP 136/82 | Ht 77.0 in | Wt 192.0 lb

## 2021-03-29 DIAGNOSIS — M5412 Radiculopathy, cervical region: Secondary | ICD-10-CM
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
## 2021-03-29 NOTE — Patient Instructions (Signed)
You have cervical radiculopathy (a pinched nerve in the neck). You should be improved by now which makes me concerned you have a disc herniation pinching this nerve. We will go ahead with an MRI of your cervical spine. I will call you with the results and next steps. Heat 15 minutes at a time 3-4 times a day to help with spasms. Watch head position when on computers, texting, when sleeping in bed - should in line with back to prevent further nerve traction and irritation.

## 2021-03-29 NOTE — Assessment & Plan Note (Signed)
MRI C-Spine Heat as needed Follow up with Dr Pearletha Forge with MRI results

## 2021-03-30 NOTE — Progress Notes (Signed)
Internal Medicine Clinic Attending  Case discussed with Dr. Carter  At the time of the visit.  We reviewed the resident's history and exam and pertinent patient test results.  I agree with the assessment, diagnosis, and plan of care documented in the resident's note.  

## 2021-04-05 ENCOUNTER — Telehealth: Payer: Self-pay | Admitting: *Deleted

## 2021-04-05 NOTE — Chronic Care Management (AMB) (Signed)
°  Care Management   Note  04/05/2021 Name: Travis Burns MRN: [id number] DOB: [birth year]  Travis Burns is a 31 y.o. year old male who is a primary care patient of Travis Haller, MD and is actively engaged with the care management team. I reached out to Travis Burns by phone today to assist with re-scheduling an initial visit with the BSW  Follow up plan: Unsuccessful telephone outreach attempt made. A HIPAA compliant phone message was left for the patient providing contact information and requesting a return call.  The care management team will reach out to the patient again over the next 7 days.  If patient returns call to provider office, please advise to call Embedded Care Management Care Guide Travis Burns at [phone].  Travis Burns  Care Guide, Embedded Care Coordination [facility] Management  Direct Dial: [phone]

## 2021-04-06 ENCOUNTER — Ambulatory Visit
Admission: RE | Admit: 2021-04-06 | Discharge: 2021-04-06 | Disposition: A | Discharge status: Home / Self Care | Payer: Self-pay | Source: Ambulatory Visit | Attending: Family Medicine | Admitting: Family Medicine

## 2021-04-06 ENCOUNTER — Other Ambulatory Visit: Payer: Self-pay

## 2021-04-06 DIAGNOSIS — M5412 Radiculopathy, cervical region: Secondary | ICD-10-CM

## 2021-04-08 ENCOUNTER — Other Ambulatory Visit: Payer: Self-pay

## 2021-04-08 DIAGNOSIS — M542 Cervicalgia: Secondary | ICD-10-CM

## 2021-04-11 ENCOUNTER — Other Ambulatory Visit: Payer: Self-pay

## 2021-04-12 NOTE — Chronic Care Management (AMB) (Signed)
°  Care Management   Note  04/12/2021 Name: Travis Burns MRN: [id number] DOB: 04/14/1990  Travis Burns is a 31 y.o. year old male who is a primary care patient of Rick Duff, MD and is actively engaged with the care management team. I reached out to [facility] by phone today to assist with re-scheduling an initial visit with the BSW  Follow up plan: Unsuccessful telephone outreach attempt made. A HIPAA compliant phone message was left for the patient providing contact information and requesting a return call.  The care management team will reach out to the patient again over the next 7 days.  If patient returns call to provider office, please advise to call [place] at [phone].  [place] Management  Direct Dial: [phone]

## 2021-04-19 NOTE — Chronic Care Management (AMB) (Signed)
°  Care Management   Note  04/19/2021 Name: Travis Burns MRN: [id number] DOB: 1990/04/04  Travis Burns is a 31 y.o. year old male who is a primary care patient of Rick Duff, MD and is actively engaged with the care management team. I reached out to [facility] by phone today to assist with re-scheduling a follow up visit with the BSW  Follow up plan: Patient declines further follow up and engagement by the care management team. Appropriate care team members and provider have been notified via electronic communication.  The care management team is available to follow up with the patient after provider conversation with the patient regarding recommendation for care management engagement and subsequent re-referral to the care management team.   [place] Management  Direct Dial: [phone]
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
## 2021-04-21 ENCOUNTER — Other Ambulatory Visit: Payer: Self-pay

## 2021-04-21 ENCOUNTER — Ambulatory Visit: Payer: Self-pay | Attending: Family Medicine

## 2021-04-21 DIAGNOSIS — R252 Cramp and spasm: Secondary | ICD-10-CM | POA: Insufficient documentation

## 2021-04-21 DIAGNOSIS — R293 Abnormal posture: Secondary | ICD-10-CM | POA: Insufficient documentation

## 2021-04-21 DIAGNOSIS — M542 Cervicalgia: Secondary | ICD-10-CM | POA: Diagnosis present

## 2021-04-26 NOTE — Therapy (Signed)
OUTPATIENT PHYSICAL THERAPY TREATMENT NOTE   Patient Name: Travis Burns MRN: [id number] DOB:1991-02-15, 31 y.o., male Today's Date: 04/27/2021  PCP: Marolyn Haller, MD REFERRING PROVIDER: Marolyn Haller, MD   PT End of Session - 04/27/21 1829     Visit Number 2    Number of Visits 13    Authorization Type [employer]    Progress Note Due on Visit 10    PT Start Time 1829    PT Stop Time 1910    PT Time Calculation (min) 41 min    Activity Tolerance Patient limited by pain    Behavior During Therapy [facility] for tasks assessed/performed             Past Medical History:  Diagnosis Date   ANHEDONIA 11/05/2009   Asthma    Broken arm    left   Bronchitis 01/31/2012   Dehydration 03/30/2012   Dermatitis 01/31/2012   Genital warts 06/29/2010   HIDRADENITIS SUPPURATIVA 03/23/2010   Other urethritis(597.89) 10/02/2009   Penile rash 04/2013   Dermatitis: nummular eczema vs Lichen planus (cutivate rx'd by derm)   Shoulder pain, right 03/30/2012   Past Surgical History:  Procedure Laterality Date   TREATMENT FISTULA ANAL     Patient Active Problem List   Diagnosis Date Noted   Cervical radiculopathy 03/29/2021   Trapezius muscle strain, left, subsequent encounter 03/12/2021   Tinea corporis 07/28/2013   Internal and external thrombosed hemorrhoids 11/22/2012   Pain of right scapula 10/08/2012   Penile rash 08/30/2012   Dehydration 03/30/2012   Shoulder pain, right 03/30/2012   Dermatitis 01/31/2012   Abdominal pain 10/31/2011   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 It's been throbbing since the evaluation."   PAIN:  Are you having pain? Yes NPRS scale:  10/10 Pain location: posterior neck   PAIN TYPE: throbbing Pain description: constant  Aggravating factors: "waking up" Relieving factors: laying down  OBJECTIVE:  *Unless otherwise noted all objective measures were captured on initial evaluation.    DIAGNOSTIC FINDINGS:    MRI Cervical 04/06/21 FINDINGS: Alignment: No significant listhesis.   Vertebrae: Vertebral body heights are maintained. No marrow edema. No suspicious osseous lesion.   Cord: No abnormal signal.   Posterior Fossa, vertebral arteries, paraspinal tissues: Unremarkable.   Disc levels: Small disc bulge at C4-C5. Mild left foraminal stenosis at this level. No canal stenosis at any level.   IMPRESSION: Mild degenerative disc disease at C4-C5.  No high-grade stenosis.   PATIENT SURVEYS:  FOTO 46% functional status; predicted 62%. Ospro 68.4     COGNITION: Overall cognitive status: Within functional limits for tasks assessed            SENSATION: Light touch: Deficits decreased light touch pad of L thumb   POSTURE:  Slouched sitting position with forward head, rounded shoulders, increased kyphosis, decreased lordosis. Pt reports this is the position he is most comfortable in. Sitting with better posture increases the pt's L neck and shoulder pain.   PALPATION: TTP to the bilat upper trap and levator L>R. Pressure to L upper trap TP reduces the pt's L thumb numbness.     CERVICAL AROM/PROM   A/PROM A/PROM (deg) 04/22/2021  Flexion 60 increased  L cervical and L thumb tingling  Extension 50 increased  L cervical and L thumb tingling  Right lateral flexion 40 increased  R and L cervical and L thumb tingling  Left lateral flexion 30 increased  L cervical and L thumb tingling  Right rotation 70 increased  L cervical and L thumb tingling  Left rotation 50 increased  L cervical and L thumb tingling   (Blank rows = not tested)   UE AROM/PROM:   UE AROMs are WNLs and equal to each other   UE MMT:            Myotomal screen was negative c 5/5 strength demonstrated bilat    CERVICAL SPECIAL TESTS:  Spurlings test was positive L. Distraction was positive for decreasing L cervical pain.   TODAY'S TREATMENT:   [facility] Adult PT Treatment:                                                DATE: 04/26/21 Therapeutic Exercise: Cervical extension SNAG 1 x 10  Cervical rotation SNAG 1 x 10 bilateral  Scapular retraction 2 x 10  Shoulder rolls 2 x 10  Manual Therapy: STM/ trigger point release bilateral upper trap, levator scap, cervical paraspinals Suboccipital release C2-C7 CPAs and UPAs grade II-III   Evaluation Treatment  Attempted cervical retraction, cervical retraction with lat flex L and R, cervical retraction with rotation L and R. Each movement increased L cervical pain. Cervical retractions were stopped. Seated Upper Trapezius Stretch 3 reps 20 hold L and R Gentle Levator Scapulae Stretch 3 reps 20 hold La nd R   PATIENT EDUCATION:  Education details: TPDN indications, expectations, side effects  Person educated: Patient Education method: Explanation Education comprehension: verbalized understanding     HOME EXERCISE PROGRAM: Access Code: [email or web address] URL: [email or web address] Date: 04/22/2021 Prepared by: Joellyn Rued   Exercises Seated Upper Trapezius Stretch - 1 x daily - 7 x weekly - 1 sets - 3 reps - 20 hold Gentle Levator Scapulae Stretch - 1 x daily - 7 x weekly - 1 sets - 3 reps - 20 hold     ASSESSMENT:   CLINICAL IMPRESSION: Patient arrives with high pain levels, though in no obvious distress throughout session. He has significant tautness and trigger points about Lt upper trap, cervical paraspinals with partial release from manual therapy. He will potentially benefit from TPDN at future sessions if tautness remains. Notable hypomobility throughout cervical spine requiring continued emphasis on improving mobility at future sessions. Fair tolerance to there  ex as patient reports continued pain throughout session. Heavy postural cues required with all there ex with occasional ability to correct. No change in pain at end of session.      OBJECTIVE IMPAIRMENTS decreased ROM, decreased strength, increased muscle spasms, impaired UE functional use, postural dysfunction, and pain.    ACTIVITY LIMITATIONS cleaning, driving, meal prep, laundry, yard work, and shopping.    PERSONAL FACTORS Fitness, Past/current experiences, and Time since onset of injury/illness/exacerbation are also affecting patient's functional outcome.    GOALS:   SHORT TERM GOALS= LTGs     LONG TERM GOALS:    LTG Name Target Date Goal status  1 Pt will be Ind in a HEP to maintain achieved LOF Baseline: 06/11/21 INITIAL  2 Pt will report a decrease in cervical pain to 4/10  or less for improved cervical and upper body function and QOL Baseline:8-10/10 06/11/21 INITIAL  3 Pt's cervical ROM will increase: Ext to 60d, L and R lat flexion to 45d, L rotation 70d Baseline:See flow sheets 06/11/21 INITIAL  4 Pt's FOTO function score will increase to the predicted value of 62%  Baseline: 46% functional status 06/11/21 INITIAL  5 Pt will demonstrate proper sitting posture with decreased cervical pain   Baseline: Slouch posture 06/11/21 INITIAL    PLAN: PT FREQUENCY: 2x/week   PT DURATION: 6 weeks   PLANNED INTERVENTIONS: Therapeutic exercises, Therapeutic activity, Patient/Family education, Joint mobilization, Dry Needling, Electrical stimulation, Cryotherapy, Moist heat, Taping, Traction, Ultrasound, Ionotophoresis 4mg /ml Dexamethasone, and Manual therapy   PLAN FOR NEXT SESSION: Assess response to HEP, progress therex as indicated, use of modalities, manual techniques, and TPDN as indicated   Letitia Libra, PT, DPT, ATC 04/27/21 7:11 PM

## 2021-04-27 ENCOUNTER — Ambulatory Visit: Payer: Self-pay

## 2021-04-27 ENCOUNTER — Other Ambulatory Visit: Payer: Self-pay

## 2021-04-27 DIAGNOSIS — M542 Cervicalgia: Secondary | ICD-10-CM

## 2021-04-27 DIAGNOSIS — R293 Abnormal posture: Secondary | ICD-10-CM

## 2021-04-27 DIAGNOSIS — R252 Cramp and spasm: Secondary | ICD-10-CM

## 2021-04-27 NOTE — Patient Instructions (Signed)

## 2021-04-28 NOTE — Therapy (Signed)
OUTPATIENT PHYSICAL THERAPY TREATMENT NOTE   Patient Name: Travis Burns MRN: [id number] DOB:09/08/1990, 31 y.o., male Today's Date: 04/30/2021  PCP: Marolyn Haller, MD REFERRING PROVIDER: Lenda Kelp, MD   PT End of Session - 04/29/21 1630     Visit Number 3    Number of Visits 13    Authorization Type [employer]    Progress Note Due on Visit 10    PT Start Time 1630    PT Stop Time 1715    PT Time Calculation (min) 45 min    Activity Tolerance Patient limited by pain    Behavior During Therapy [facility] for tasks assessed/performed              Past Medical History:  Diagnosis Date   ANHEDONIA 11/05/2009   Asthma    Broken arm    left   Bronchitis 01/31/2012   Dehydration 03/30/2012   Dermatitis 01/31/2012   Genital warts 06/29/2010   HIDRADENITIS SUPPURATIVA 03/23/2010   Other urethritis(597.89) 10/02/2009   Penile rash 04/2013   Dermatitis: nummular eczema vs Lichen planus (cutivate rx'd by derm)   Shoulder pain, right 03/30/2012   Past Surgical History:  Procedure Laterality Date   TREATMENT FISTULA ANAL     Patient Active Problem List   Diagnosis Date Noted   Cervical radiculopathy 03/29/2021   Trapezius muscle strain, left, subsequent encounter 03/12/2021   Tinea corporis 07/28/2013   Internal and external thrombosed hemorrhoids 11/22/2012   Pain of right scapula 10/08/2012   Penile rash 08/30/2012   Dehydration 03/30/2012   Shoulder pain, right 03/30/2012   Dermatitis 01/31/2012   Abdominal pain 10/31/2011   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 It's throbbing. A massager helps while Im using it, but the pain comes back when ii stop using  it. The exercises seem to make it worse. The pain can go down to 5/10 if I sit up straight and keep my head still, but as soon as I move it , it shoot up to a 10/10.   PAIN:  Are you having pain? Yes NPRS scale: 10/10 Pain location: posterior neck   PAIN TYPE: throbbing Pain description: constant  Aggravating factors: "waking up" Relieving factors: laying down  OBJECTIVE:  *Unless otherwise noted all objective measures were captured on initial evaluation.    DIAGNOSTIC FINDINGS:    MRI Cervical 04/06/21 FINDINGS: Alignment: No significant listhesis.   Vertebrae: Vertebral body heights are maintained. No marrow edema. No suspicious osseous lesion.   Cord: No abnormal signal.   Posterior Fossa, vertebral arteries, paraspinal tissues: Unremarkable.   Disc levels: Small disc bulge at C4-C5. Mild left foraminal stenosis at this level. No canal stenosis at any level.   IMPRESSION: Mild degenerative disc disease at C4-C5.  No high-grade stenosis.   PATIENT SURVEYS:  FOTO 46% functional status; predicted 62%. Ospro 68.4     COGNITION: Overall cognitive status: Within functional limits for tasks assessed            SENSATION: Light touch: Deficits decreased light touch pad of L thumb   POSTURE:  Slouched sitting position with forward head, rounded shoulders, increased kyphosis, decreased lordosis. Pt reports this is the position  he is most comfortable in. Sitting with better posture increases the pt's L neck and shoulder pain.   PALPATION: TTP to the bilat upper trap and levator L>R. Pressure to L upper trap TP reduces the pt's L thumb numbness.     CERVICAL AROM/PROM   A/PROM A/PROM (deg) 04/22/2021  Flexion 60 increased  L cervical and L thumb tingling  Extension 50 increased  L cervical and L thumb tingling  Right lateral flexion 40 increased  R and L cervical and L thumb tingling  Left lateral flexion 30 increased  L cervical and L thumb tingling  Right rotation  70 increased  L cervical and L thumb tingling  Left rotation 50 increased  L cervical and L thumb tingling   (Blank rows = not tested)   UE AROM/PROM:   UE AROMs are WNLs and equal to each other   UE MMT:           Myotomal screen was negative c 5/5 strength demonstrated bilat    CERVICAL SPECIAL TESTS:  Spurlings test was positive L. Distraction was positive for decreasing L cervical pain.   TODAY'S TREATMENT:  [facility] Adult PT Treatment:                                                DATE: 04/29/21 Therapeutic Exercise: Cervical isometric strengthening for flex, ext, L and R lat flexion x5 5" hold each Manual Therapy: STM/DTM to the bilat suboccipitals, cervical paraspinals, upper trap and levator Self Care: Updated HEP  Trigger Point Dry Needling Treatment: Pre-treatment instruction: Patient instructed on dry needling rationale, procedures, and possible side effects including pain during treatment (achy,cramping feeling), bruising, drop of blood, lightheadedness, nausea, sweating. Patient Consent Given: Yes Education handout provided: Yes Muscles treated: Mid to lower L cervical multifidi, L upper trap   Needle size and number: .30x17mm x 1 and .30x28mm x 1 Electrical stimulation performed: No Parameters: N/A Treatment response/outcome: Twitch response elicited and Palpable decrease in muscle tension Post-treatment instructions: Patient instructed to expect possible mild to moderate muscle soreness later today and/or tomorrow. Patient instructed in methods to reduce muscle soreness and to continue prescribed HEP. If patient was dry needled over the lung field, patient was instructed on signs and symptoms of pneumothorax and, however unlikely, to see immediate medical attention should they occur. Patient was also educated on signs and symptoms of infection and to seek medical attention should they occur. Patient verbalized understanding of these instructions and education.    [facility]  Adult PT Treatment:                                                DATE: 04/26/21 Therapeutic Exercise: Cervical extension SNAG 1 x 10  Cervical rotation SNAG 1 x 10 bilateral  Scapular retraction 2 x 10  Shoulder rolls 2 x 10  Manual Therapy: STM/ trigger point release bilateral upper trap, levator scap, cervical paraspinals Suboccipital release C2-C7 CPAs and UPAs grade II-III   Evaluation Treatment  Attempted cervical retraction, cervical retraction with lat flex L and R, cervical retraction with rotation L and R. Each movement increased L cervical pain. Cervical retractions were stopped. Seated Upper Trapezius Stretch 3 reps 20 hold L and  R Gentle Levator Scapulae Stretch 3 reps 20 hold La nd R   PATIENT EDUCATION:  Education details: TPDN indications, expectations, side effects  Person educated: Patient Education method: Explanation Education comprehension: verbalized understanding     HOME EXERCISE PROGRAM: Access Code: [email or web address] URL: [email or web address] Date: 04/22/2021 Prepared by: Joellyn Rued   Exercises Seated Upper Trapezius Stretch - 1 x daily - 7 x weekly - 1 sets - 3 reps - 20 hold Gentle Levator Scapulae Stretch - 1 x daily - 7 x weekly - 1 sets - 3 reps - 20 hold     ASSESSMENT:   CLINICAL IMPRESSION: Pt reports significant cervical pain with neck movements away from midline. PT was provided for STM/DTM f/b PTDN to taut muscles and TPs ot the L mid to lower cervical paraspinals and upper trap. This was f/b cervical isometrics for strengthening. Pt tolerated the session without adverse effects. Will assess response to TPDN next session. Anticipate an overall slow recovery due to the chronicity of pt's cervical pain and deficits.     OBJECTIVE IMPAIRMENTS decreased ROM, decreased strength, increased muscle spasms, impaired UE functional use, postural dysfunction, and pain.    ACTIVITY LIMITATIONS cleaning, driving, meal prep, laundry, yard work,  and shopping.    PERSONAL FACTORS Fitness, Past/current experiences, and Time since onset of injury/illness/exacerbation are also affecting patient's functional outcome.    GOALS:   SHORT TERM GOALS= LTGs     LONG TERM GOALS:    LTG Name Target Date Goal status  1 Pt will be Ind in a HEP to maintain achieved LOF Baseline: 06/11/21 INITIAL  2 Pt will report a decrease in cervical pain to 4/10 or less for improved cervical and upper body function and QOL Baseline:8-10/10 06/11/21 INITIAL  3 Pt's cervical ROM will increase: Ext to 60d, L and R lat flexion to 45d, L rotation 70d Baseline:See flow sheets 06/11/21 INITIAL  4 Pt's FOTO function score will increase to the predicted value of 62%  Baseline: 46% functional status 06/11/21 INITIAL  5 Pt will demonstrate proper sitting posture with decreased cervical pain   Baseline: Slouch posture 06/11/21 INITIAL    PLAN: PT FREQUENCY: 2x/week   PT DURATION: 6 weeks   PLANNED INTERVENTIONS: Therapeutic exercises, Therapeutic activity, Patient/Family education, Joint mobilization, Dry Needling, Electrical stimulation, Cryotherapy, Moist heat, Taping, Traction, Ultrasound, Ionotophoresis 4mg /ml Dexamethasone, and Manual therapy   PLAN FOR NEXT SESSION: Assess response to HEP, progress therex as indicated, use of modalities, manual techniques, and TPDN as indicated   MS, PT 04/30/21 6:08 AM

## 2021-04-29 ENCOUNTER — Other Ambulatory Visit: Payer: Self-pay

## 2021-04-29 ENCOUNTER — Ambulatory Visit: Payer: Self-pay | Attending: Family Medicine

## 2021-04-29 DIAGNOSIS — M542 Cervicalgia: Secondary | ICD-10-CM | POA: Diagnosis present

## 2021-04-29 DIAGNOSIS — R293 Abnormal posture: Secondary | ICD-10-CM | POA: Insufficient documentation

## 2021-04-29 DIAGNOSIS — R252 Cramp and spasm: Secondary | ICD-10-CM | POA: Insufficient documentation

## 2021-04-29 NOTE — Patient Instructions (Signed)

## 2021-05-03 ENCOUNTER — Ambulatory Visit: Payer: Self-pay

## 2021-05-03 ENCOUNTER — Other Ambulatory Visit: Payer: Self-pay

## 2021-05-03 DIAGNOSIS — R252 Cramp and spasm: Secondary | ICD-10-CM

## 2021-05-03 DIAGNOSIS — M542 Cervicalgia: Secondary | ICD-10-CM

## 2021-05-03 DIAGNOSIS — R293 Abnormal posture: Secondary | ICD-10-CM

## 2021-05-03 NOTE — Therapy (Signed)
OUTPATIENT PHYSICAL THERAPY TREATMENT NOTE   Patient Name: Travis Burns MRN: [id number] DOB:03-02-1990, 31 y.o., male Today's Date: 05/03/2021  PCP: Marolyn Haller, MD REFERRING PROVIDER: Marolyn Haller, MD   PT End of Session - 05/03/21 1635     Visit Number 4    Number of Visits 13    Authorization Type [employer]    Progress Note Due on Visit 10    PT Start Time 1633    PT Stop Time 1713    PT Time Calculation (min) 40 min    Activity Tolerance Patient limited by pain    Behavior During Therapy [facility] for tasks assessed/performed               Past Medical History:  Diagnosis Date   ANHEDONIA 11/05/2009   Asthma    Broken arm    left   Bronchitis 01/31/2012   Dehydration 03/30/2012   Dermatitis 01/31/2012   Genital warts 06/29/2010   HIDRADENITIS SUPPURATIVA 03/23/2010   Other urethritis(597.89) 10/02/2009   Penile rash 04/2013   Dermatitis: nummular eczema vs Lichen planus (cutivate rx'd by derm)   Shoulder pain, right 03/30/2012   Past Surgical History:  Procedure Laterality Date   TREATMENT FISTULA ANAL     Patient Active Problem List   Diagnosis Date Noted   Cervical radiculopathy 03/29/2021   Trapezius muscle strain, left, subsequent encounter 03/12/2021   Tinea corporis 07/28/2013   Internal and external thrombosed hemorrhoids 11/22/2012   Pain of right scapula 10/08/2012   Penile rash 08/30/2012   Dehydration 03/30/2012   Shoulder pain, right 03/30/2012   Dermatitis 01/31/2012   Abdominal pain 10/31/2011   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 He reports no change in his pain with TPDN.   PAIN:  Are you having pain? Yes NPRS scale: 8/10 Pain location: posterior neck   PAIN TYPE: throbbing Pain description: constant  Aggravating factors: "waking up" Relieving factors: laying down  OBJECTIVE:  *Unless otherwise noted all objective measures were captured on initial evaluation.    DIAGNOSTIC FINDINGS:    MRI Cervical 04/06/21 FINDINGS: Alignment: No significant listhesis.   Vertebrae: Vertebral body heights are maintained. No marrow edema. No suspicious osseous lesion.   Cord: No abnormal signal.   Posterior Fossa, vertebral arteries, paraspinal tissues: Unremarkable.   Disc levels: Small disc bulge at C4-C5. Mild left foraminal stenosis at this level. No canal stenosis at any level.   IMPRESSION: Mild degenerative disc disease at C4-C5.  No high-grade stenosis.   PATIENT SURVEYS:  FOTO 46% functional status; predicted 62%. Ospro 68.4     COGNITION: Overall cognitive status: Within functional limits for tasks assessed            SENSATION: Light touch: Deficits decreased light touch pad of L thumb   POSTURE:  Slouched sitting position with forward head, rounded shoulders, increased kyphosis, decreased lordosis. Pt reports this is the position he is most comfortable in. Sitting with better posture increases the pt's L neck and shoulder pain.   PALPATION: TTP to the bilat upper trap and levator L>R. Pressure to L upper trap TP reduces the pt's L thumb numbness.  CERVICAL AROM/PROM   A/PROM A/PROM (deg) 04/22/2021 05/03/21  Flexion 60 increased  L cervical and L thumb tingling   Extension 50 increased  L cervical and L thumb tingling   Right lateral flexion 40 increased  R and L cervical and L thumb tingling 40  Left lateral flexion 30 increased  L cervical and L thumb tingling 35  Right rotation 70 increased  L cervical and L thumb tingling   Left rotation 50 increased  L cervical and L thumb tingling    (Blank rows = not tested)   UE  AROM/PROM:   UE AROMs are WNLs and equal to each other   UE MMT:           Myotomal screen was negative c 5/5 strength demonstrated bilat    CERVICAL SPECIAL TESTS:  Spurlings test was positive L. Distraction was positive for decreasing L cervical pain.   TODAY'S TREATMENT:  [facility] Adult PT Treatment:                                                DATE: 05/03/21 Therapeutic Exercise: Shoulder rolls 1 x 10  Seated Scapular retraction 1 x 10  Cervical retraction in supine 2 x 10  Supine scapular retraction 2 x 10  Resisted horizontal shoulder abduction yellow band 2 x 10 Bilateral shoulder ER yellow band 2 x 15  Cervical rotation AROM in supine 2 x 10  Cervical extension SNAG upper and lower 1 x 10  Cervical rotation SNAG 1 x 10 bilateral      [facility] Adult PT Treatment:                                                DATE: 04/29/21 Therapeutic Exercise: Cervical isometric strengthening for flex, ext, L and R lat flexion x5 5" hold each Manual Therapy: STM/DTM to the bilat suboccipitals, cervical paraspinals, upper trap and levator Self Care: Updated HEP  Trigger Point Dry Needling Treatment: Pre-treatment instruction: Patient instructed on dry needling rationale, procedures, and possible side effects including pain during treatment (achy,cramping feeling), bruising, drop of blood, lightheadedness, nausea, sweating. Patient Consent Given: Yes Education handout provided: Yes Muscles treated: Mid to lower L cervical multifidi, L upper trap   Needle size and number: .30x62mm x 1 and .30x79mm x 1 Electrical stimulation performed: No Parameters: N/A Treatment response/outcome: Twitch response elicited and Palpable decrease in muscle tension Post-treatment instructions: Patient instructed to expect possible mild to moderate muscle soreness later today and/or tomorrow. Patient instructed in methods to reduce muscle soreness and to continue prescribed HEP. If patient was dry needled over the  lung field, patient was instructed on signs and symptoms of pneumothorax and, however unlikely, to see immediate medical attention should they occur. Patient was also educated on signs and symptoms of infection and to seek medical attention should they occur. Patient verbalized understanding of these instructions and education.    [facility] Adult PT Treatment:                                                DATE:  04/26/21 Therapeutic Exercise: Cervical extension SNAG 1 x 10  Cervical rotation SNAG 1 x 10 bilateral  Scapular retraction 2 x 10  Shoulder rolls 2 x 10  Manual Therapy: STM/ trigger point release bilateral upper trap, levator scap, cervical paraspinals Suboccipital release C2-C7 CPAs and UPAs grade II-III   Evaluation Treatment  Attempted cervical retraction, cervical retraction with lat flex L and R, cervical retraction with rotation L and R. Each movement increased L cervical pain. Cervical retractions were stopped. Seated Upper Trapezius Stretch 3 reps 20 hold L and R Gentle Levator Scapulae Stretch 3 reps 20 hold La nd R   PATIENT EDUCATION:  Education details: n/a Person educated: n/a Education method: n/a Education comprehension: n/a     HOME EXERCISE PROGRAM: Access Code: [email or web address] URL: [email or web address] Date: 04/22/2021 Prepared by: Joellyn Rued   Exercises Seated Upper Trapezius Stretch - 1 x daily - 7 x weekly - 1 sets - 3 reps - 20 hold Gentle Levator Scapulae Stretch - 1 x daily - 7 x weekly - 1 sets - 3 reps - 20 hold     ASSESSMENT:   CLINICAL IMPRESSION: Patient continues to report high pain levels upon arrival, though in no obvious distress throughout session. Fair tolerance to gentle periscapular and cervical strengthening/ROM as patient continues to report cervical pain throughout session. He does demonstrate improved postural awareness with ability to achieve and maintain appropriate sitting posture with minimal cueing.        OBJECTIVE IMPAIRMENTS decreased ROM, decreased strength, increased muscle spasms, impaired UE functional use, postural dysfunction, and pain.    ACTIVITY LIMITATIONS cleaning, driving, meal prep, laundry, yard work, and shopping.    PERSONAL FACTORS Fitness, Past/current experiences, and Time since onset of injury/illness/exacerbation are also affecting patient's functional outcome.    GOALS:   SHORT TERM GOALS= LTGs     LONG TERM GOALS:    LTG Name Target Date Goal status  1 Pt will be Ind in a HEP to maintain achieved LOF Baseline: 06/11/21 INITIAL  2 Pt will report a decrease in cervical pain to 4/10 or less for improved cervical and upper body function and QOL Baseline:8-10/10 06/11/21 INITIAL  3 Pt's cervical ROM will increase: Ext to 60d, L and R lat flexion to 45d, L rotation 70d Baseline:See flow sheets 06/11/21 INITIAL  4 Pt's FOTO function score will increase to the predicted value of 62%  Baseline: 46% functional status 06/11/21 INITIAL  5 Pt will demonstrate proper sitting posture with decreased cervical pain   Baseline: Slouch posture 06/11/21 INITIAL    PLAN: PT FREQUENCY: 2x/week   PT DURATION: 6 weeks   PLANNED INTERVENTIONS: Therapeutic exercises, Therapeutic activity, Patient/Family education, Joint mobilization, Dry Needling, Electrical stimulation, Cryotherapy, Moist heat, Taping, Traction, Ultrasound, Ionotophoresis 4mg /ml Dexamethasone, and Manual therapy   PLAN FOR NEXT SESSION: Assess response to HEP, progress therex as indicated, use of modalities, manual techniques, and TPDN as indicated  Letitia Libra, PT, DPT, ATC 05/03/21 5:13 PM

## 2021-05-05 NOTE — Therapy (Signed)
OUTPATIENT PHYSICAL THERAPY TREATMENT NOTE   Patient Name: Travis Burns MRN: [id number] DOB:1990-06-27, 31 y.o., male Today's Date: 05/07/2021  PCP: Marolyn Haller, MD REFERRING PROVIDER: Lenda Kelp, MD   PT End of Session - 05/07/21 0617     Visit Number 5    Number of Visits 13    Authorization Type [employer]    Progress Note Due on Visit 10    PT Start Time 1635    PT Stop Time 1722    PT Time Calculation (min) 47 min    Activity Tolerance Patient limited by pain    Behavior During Therapy [facility] for tasks assessed/performed                Past Medical History:  Diagnosis Date   ANHEDONIA 11/05/2009   Asthma    Broken arm    left   Bronchitis 01/31/2012   Dehydration 03/30/2012   Dermatitis 01/31/2012   Genital warts 06/29/2010   HIDRADENITIS SUPPURATIVA 03/23/2010   Other urethritis(597.89) 10/02/2009   Penile rash 04/2013   Dermatitis: nummular eczema vs Lichen planus (cutivate rx'd by derm)   Shoulder pain, right 03/30/2012   Past Surgical History:  Procedure Laterality Date   TREATMENT FISTULA ANAL     Patient Active Problem List   Diagnosis Date Noted   Cervical radiculopathy 03/29/2021   Trapezius muscle strain, left, subsequent encounter 03/12/2021   Tinea corporis 07/28/2013   Internal and external thrombosed hemorrhoids 11/22/2012   Pain of right scapula 10/08/2012   Penile rash 08/30/2012   Dehydration 03/30/2012   Shoulder pain, right 03/30/2012   Dermatitis 01/31/2012   Abdominal pain 10/31/2011   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   PAIN:  Are you having pain? Yes NPRS scale: 8/10 in sitting, 5/10 when  standing and walking Pain location: posterior neck   PAIN TYPE: throbbing Pain description: constant  Aggravating factors: "waking up" Relieving factors: laying down  OBJECTIVE:  *Unless otherwise noted all objective measures were captured on initial evaluation.    DIAGNOSTIC FINDINGS:    MRI Cervical 04/06/21 FINDINGS: Alignment: No significant listhesis.   Vertebrae: Vertebral body heights are maintained. No marrow edema. No suspicious osseous lesion.   Cord: No abnormal signal.   Posterior Fossa, vertebral arteries, paraspinal tissues: Unremarkable.   Disc levels: Small disc bulge at C4-C5. Mild left foraminal stenosis at this level. No canal stenosis at any level.   IMPRESSION: Mild degenerative disc disease at C4-C5.  No high-grade stenosis.   PATIENT SURVEYS:  FOTO 46% functional status; predicted 62%. Ospro 68.4     COGNITION: Overall cognitive status: Within functional limits for tasks assessed            SENSATION: Light touch: Deficits decreased light touch pad of L thumb   POSTURE:  Slouched sitting position with forward head, rounded shoulders, increased kyphosis, decreased lordosis. Pt reports this is the position he is most comfortable in. Sitting with better posture increases the pt's L neck and shoulder pain.   PALPATION: TTP to the bilat upper trap and levator L>R. Pressure to L upper trap TP reduces the pt's L thumb numbness.     CERVICAL  AROM/PROM   A/PROM A/PROM (deg) 04/22/2021 05/03/21  Flexion 60 increased  L cervical and L thumb tingling   Extension 50 increased  L cervical and L thumb tingling   Right lateral flexion 40 increased  R and L cervical and L thumb tingling 40  Left lateral flexion 30 increased  L cervical and L thumb tingling 35  Right rotation 70 increased  L cervical and L thumb tingling   Left rotation 50 increased  L cervical and L thumb tingling    (Blank rows = not tested)   UE AROM/PROM:   UE AROMs are WNLs and  equal to each other   UE MMT:           Myotomal screen was negative c 5/5 strength demonstrated bilat    CERVICAL SPECIAL TESTS:  Spurlings test was positive L. Distraction was positive for decreasing L cervical pain.   TODAY'S TREATMENT:  [facility] Adult PT Treatment:                                                DATE: 05/06/21 Therapeutic Exercise: Cervical retraction in supine 1 x 10  DNF 1 x10 5" Shoulder rolls 1 x 10  Standing Scapular retraction 2 x 10  Resisted horizontal shoulder abduction red band 2 x 10 Bilateral shoulder ER red band 2 x 15  Cervical rotation SNAG 1 x 5  bilateral  10" Cervical isometrics x5 5' for flex, ext, L and R lat flexion UBE L1 2 mins forward/backward Updated HEP  [facility] Adult PT Treatment:                                                DATE: 05/03/21 Therapeutic Exercise: Shoulder rolls 1 x 10  Seated Scapular retraction 1 x 10  Cervical retraction in supine 2 x 10  Supine scapular retraction 2 x 10  Resisted horizontal shoulder abduction yellow band 2 x 10 Bilateral shoulder ER yellow band 2 x 15  Cervical rotation AROM in supine 2 x 10  Cervical extension SNAG upper and lower 1 x 10  Cervical rotation SNAG 1 x 10 bilateral      [facility] Adult PT Treatment:                                                DATE: 04/29/21 Therapeutic Exercise: Cervical isometric strengthening for flex, ext, L and R lat flexion x5 5" hold each Manual Therapy: STM/DTM to the bilat suboccipitals, cervical paraspinals, upper trap and levator Self Care: Updated HEP  Trigger Point Dry Needling Treatment: Pre-treatment instruction: Patient instructed on dry needling rationale, procedures, and possible side effects including pain during treatment (achy,cramping feeling), bruising, drop of blood, lightheadedness, nausea, sweating. Patient Consent Given: Yes Education handout provided: Yes Muscles treated: Mid to lower L cervical multifidi, L upper trap   Needle size and  number: .30x9mm x 1 and .30x41mm x 1 Electrical stimulation performed: No Parameters: N/A Treatment response/outcome: Twitch response elicited and Palpable decrease in muscle tension Post-treatment instructions: Patient instructed to expect possible mild to moderate muscle soreness  later today and/or tomorrow. Patient instructed in methods to reduce muscle soreness and to continue prescribed HEP. If patient was dry needled over the lung field, patient was instructed on signs and symptoms of pneumothorax and, however unlikely, to see immediate medical attention should they occur. Patient was also educated on signs and symptoms of infection and to seek medical attention should they occur. Patient verbalized understanding of these instructions and education.    PATIENT EDUCATION:  Education details: n/a Person educated: n/a Education method: n/a Education comprehension: n/a     HOME EXERCISE PROGRAM: Access Code: [email or web address] URL: [email or web address] Date: 05/07/2021 Prepared by: Joellyn Rued  Exercises Seated Upper Trapezius Stretch - 1 x daily - 7 x weekly - 1 sets - 3 reps - 20 hold Gentle Levator Scapulae Stretch - 1 x daily - 7 x weekly - 1 sets - 3 reps - 20 hold Standing Isometric Cervical Flexion with Manual Resistance - 2 x daily - 7 x weekly - 1 sets - 5 reps - 5 hold Standing Isometric Cervical Sidebending with Manual Resistance - 2 x daily - 7 x weekly - 1 sets - 5 reps - 5 hold Standing Isometric Cervical Extension with Manual Resistance - 2 x daily - 7 x weekly - 1 sets - 5 reps - 5 hold Seated Assisted Cervical Rotation with Towel - 1 x daily - 7 x weekly - 1 sets - 5 reps - 10 hold Supine DNF Liftoffs - 1 x daily - 7 x weekly - 1 sets - 5 reps - 5 hold      ASSESSMENT:   CLINICAL IMPRESSION: PT was completed for cervical ROM and strengthening and posterior chain strengthening to address pain, posture, and function. Pt reports being consistent with completing  his HEP. Pt notes his neck pain is some better which is encouraging considering the year long time frame since the accident. Pt did demonstrate improve posture more consistently during today's PT session. Pt tolerated the session well with no apparent issue of distress. HEP was updated. Pt will continue to benefit from skilled PT to address neck function and QOL.    OBJECTIVE IMPAIRMENTS decreased ROM, decreased strength, increased muscle spasms, impaired UE functional use, postural dysfunction, and pain.    ACTIVITY LIMITATIONS cleaning, driving, meal prep, laundry, yard work, and shopping.    PERSONAL FACTORS Fitness, Past/current experiences, and Time since onset of injury/illness/exacerbation are also affecting patient's functional outcome.    GOALS:   SHORT TERM GOALS= LTGs     LONG TERM GOALS:    LTG Name Target Date Goal status  1 Pt will be Ind in a HEP to maintain achieved LOF. Ind with current HEP Baseline: 06/11/21 Ongoing  2 Pt will report a decrease in cervical pain to 4/10 or less for improved cervical and upper body function and QOL Baseline:8-10/10 06/11/21 Ongoing  3 Pt's cervical ROM will increase: Ext to 60d, L and R lat flexion to 45d, L rotation 70d Baseline:See flow sheets 06/11/21 INITIAL  4 Pt's FOTO function score will increase to the predicted value of 62%  Baseline: 46% functional status 06/11/21 INITIAL  5 Pt will demonstrate proper sitting posture with decreased cervical pain. 05/06/21=Improving  Baseline: Slouch posture 06/11/21 Ongoing    PLAN: PT FREQUENCY: 2x/week   PT DURATION: 6 weeks   PLANNED INTERVENTIONS: Therapeutic exercises, Therapeutic activity, Patient/Family education, Joint mobilization, Dry Needling, Electrical stimulation, Cryotherapy, Moist heat, Taping, Traction, Ultrasound, Ionotophoresis /ml Dexamethasone, and Manual therapy   PLAN FOR NEXT  SESSION: Assess response to HEP, progress therex as indicated, use of modalities, manual  techniques, and TPDN as indicated. Reassess FOTO.  Trey Bebee MS, PT 05/07/21 11:37 AM

## 2021-05-06 ENCOUNTER — Ambulatory Visit: Payer: Self-pay

## 2021-05-06 ENCOUNTER — Other Ambulatory Visit: Payer: Self-pay

## 2021-05-06 DIAGNOSIS — R252 Cramp and spasm: Secondary | ICD-10-CM

## 2021-05-06 DIAGNOSIS — M542 Cervicalgia: Secondary | ICD-10-CM | POA: Diagnosis not present

## 2021-05-06 DIAGNOSIS — R293 Abnormal posture: Secondary | ICD-10-CM

## 2021-05-10 ENCOUNTER — Other Ambulatory Visit: Payer: Self-pay

## 2021-05-10 ENCOUNTER — Ambulatory Visit: Payer: Self-pay

## 2021-05-10 DIAGNOSIS — M542 Cervicalgia: Secondary | ICD-10-CM

## 2021-05-10 DIAGNOSIS — R293 Abnormal posture: Secondary | ICD-10-CM

## 2021-05-10 DIAGNOSIS — R252 Cramp and spasm: Secondary | ICD-10-CM

## 2021-05-10 NOTE — Therapy (Signed)
OUTPATIENT PHYSICAL THERAPY TREATMENT NOTE   Patient Name: Travis Burns MRN: [id number] DOB:1990-09-12, 31 y.o., male Today's Date: 05/10/2021  PCP: Marolyn Hallerarter, [place], MD REFERRING PROVIDER: Marolyn Hallerarter, [place], MD   PT End of Session - 05/10/21 1635     Visit Number 6    Number of Visits 13    Authorization Type [employer]    Progress Note Due on Visit --    PT Start Time 1634    PT Stop Time 1713    PT Time Calculation (min) 39 min    Activity Tolerance Patient tolerated treatment well    Behavior [facility] for tasks assessed/performed                Past Medical History:  Diagnosis Date   ANHEDONIA 11/05/2009   Asthma    Broken arm    left   Bronchitis 01/31/2012   Dehydration 03/30/2012   Dermatitis 01/31/2012   Genital warts 06/29/2010   HIDRADENITIS SUPPURATIVA 03/23/2010   Other urethritis(597.89) 10/02/2009   Penile rash 04/2013   Dermatitis: nummular eczema vs Lichen planus (cutivate rx'd by derm)   Shoulder pain, right 03/30/2012   Past Surgical History:  Procedure Laterality Date   TREATMENT FISTULA ANAL     Patient Active Problem List   Diagnosis Date Noted   Cervical radiculopathy 03/29/2021   Trapezius muscle strain, left, subsequent encounter 03/12/2021   Tinea corporis 07/28/2013   Internal and external thrombosed hemorrhoids 11/22/2012   Pain of right scapula 10/08/2012   Penile rash 08/30/2012   Dehydration 03/30/2012   Shoulder pain, right 03/30/2012   Dermatitis 01/31/2012   Abdominal pain 10/31/2011   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 It's like a 5."  PAIN:  Are you having pain? Yes NPRS scale: 5/10 Pain location:  posterior neck   PAIN TYPE: throbbing Pain description: constant  Aggravating factors: "waking up" Relieving factors: laying down  OBJECTIVE:  *Unless otherwise noted all objective measures were captured on initial evaluation.    DIAGNOSTIC FINDINGS:    MRI Cervical 04/06/21 FINDINGS: Alignment: No significant listhesis.   Vertebrae: Vertebral body heights are maintained. No marrow edema. No suspicious osseous lesion.   Cord: No abnormal signal.   Posterior Fossa, vertebral arteries, paraspinal tissues: Unremarkable.   Disc levels: Small disc bulge at C4-C5. Mild left foraminal stenosis at this level. No canal stenosis at any level.   IMPRESSION: Mild degenerative disc disease at C4-C5.  No high-grade stenosis.   PATIENT SURVEYS:  FOTO 46% functional status; predicted 62%. Ospro 68.4 05/10/21: 48% function      COGNITION: Overall cognitive status: Within functional limits for tasks assessed            SENSATION: Light touch: Deficits decreased light touch pad of L thumb   POSTURE:  Slouched sitting position with forward head, rounded shoulders, increased kyphosis, decreased lordosis. Pt reports this is the position he is most comfortable in. Sitting with better posture increases the pt's L neck and shoulder pain.   PALPATION: TTP to the bilat upper trap and levator L>R. Pressure to L upper trap TP reduces the pt's L thumb numbness.     CERVICAL AROM/PROM   A/PROM A/PROM (  deg) 04/22/2021 05/03/21  Flexion 60 increased  L cervical and L thumb tingling   Extension 50 increased  L cervical and L thumb tingling   Right lateral flexion 40 increased  R and L cervical and L thumb tingling 40  Left lateral flexion 30 increased  L cervical and L thumb tingling 35  Right rotation 70 increased  L cervical and L thumb tingling   Left rotation 50 increased  L cervical and L thumb tingling    (Blank rows = not tested)   UE AROM/PROM:   UE AROMs are WNLs and equal to each  other   UE MMT:           Myotomal screen was negative c 5/5 strength demonstrated bilat    CERVICAL SPECIAL TESTS:  Spurlings test was positive L. Distraction was positive for decreasing L cervical pain.   TODAY'S TREATMENT:  [facility] Adult PT Treatment:                                                DATE: 05/10/21 Therapeutic Exercise: UBE level 2 x 2 min each fwd/bwd  Resisted shoulder extension red band 2 x 15  Resisted rows 2 x 15 blue band  Cervical retraction with orange ball in standing 2 x 10; 5 sec hold  Cervical retraction with rotation into orange ball 2 x 10  Pec doorway stretch 3 x 30 sec  Shoulder diagonals 2 x 10 yellow band  Prone scapular retraction 2 x 15  Prone T 2  x 15     [facility] Adult PT Treatment:                                                DATE: 05/06/21 Therapeutic Exercise: Cervical retraction in supine 1 x 10  DNF 1 x10 5" Shoulder rolls 1 x 10  Standing Scapular retraction 2 x 10  Resisted horizontal shoulder abduction red band 2 x 10 Bilateral shoulder ER red band 2 x 15  Cervical rotation SNAG 1 x 5  bilateral  10" Cervical isometrics x5 5' for flex, ext, L and R lat flexion UBE L1 2 mins forward/backward Updated HEP  [facility] Adult PT Treatment:                                                DATE: 05/03/21 Therapeutic Exercise: Shoulder rolls 1 x 10  Seated Scapular retraction 1 x 10  Cervical retraction in supine 2 x 10  Supine scapular retraction 2 x 10  Resisted horizontal shoulder abduction yellow band 2 x 10 Bilateral shoulder ER yellow band 2 x 15  Cervical rotation AROM in supine 2 x 10  Cervical extension SNAG upper and lower 1 x 10  Cervical rotation SNAG 1 x 10 bilateral      [facility] Adult PT Treatment:  DATE: 04/29/21 Therapeutic Exercise: Cervical isometric strengthening for flex, ext, L and R lat flexion x5 5" hold each Manual Therapy: STM/DTM to the bilat suboccipitals, cervical  paraspinals, upper trap and levator Self Care: Updated HEP  Trigger Point Dry Needling Treatment: Pre-treatment instruction: Patient instructed on dry needling rationale, procedures, and possible side effects including pain during treatment (achy,cramping feeling), bruising, drop of blood, lightheadedness, nausea, sweating. Patient Consent Given: Yes Education handout provided: Yes Muscles treated: Mid to lower L cervical multifidi, L upper trap   Needle size and number: .30x66mm x 1 and .30x88mm x 1 Electrical stimulation performed: No Parameters: N/A Treatment response/outcome: Twitch response elicited and Palpable decrease in muscle tension Post-treatment instructions: Patient instructed to expect possible mild to moderate muscle soreness later today and/or tomorrow. Patient instructed in methods to reduce muscle soreness and to continue prescribed HEP. If patient was dry needled over the lung field, patient was instructed on signs and symptoms of pneumothorax and, however unlikely, to see immediate medical attention should they occur. Patient was also educated on signs and symptoms of infection and to seek medical attention should they occur. Patient verbalized understanding of these instructions and education.    PATIENT EDUCATION:  Education details: n/a Person educated: n/a Education method: n/a Education comprehension: n/a     HOME EXERCISE PROGRAM: Access Code: [email or web address] URL: [email or web address] Date: 05/07/2021 Prepared by: Joellyn Rued  Exercises Seated Upper Trapezius Stretch - 1 x daily - 7 x weekly - 1 sets - 3 reps - 20 hold Gentle Levator Scapulae Stretch - 1 x daily - 7 x weekly - 1 sets - 3 reps - 20 hold Standing Isometric Cervical Flexion with Manual Resistance - 2 x daily - 7 x weekly - 1 sets - 5 reps - 5 hold Standing Isometric Cervical Sidebending with Manual Resistance - 2 x daily - 7 x weekly - 1 sets - 5 reps - 5 hold Standing Isometric  Cervical Extension with Manual Resistance - 2 x daily - 7 x weekly - 1 sets - 5 reps - 5 hold Seated Assisted Cervical Rotation with Towel - 1 x daily - 7 x weekly - 1 sets - 5 reps - 10 hold Supine DNF Liftoffs - 1 x daily - 7 x weekly - 1 sets - 5 reps - 5 hold      ASSESSMENT:   CLINICAL IMPRESSION: Patient continues to report improvement in his neck pain. FOTO score has modestly improved compared to initial evaluation. Continued with periscapular and cervical strengthening with patient requiring moderate postural cues throughout session. No increased pain reported throughout session.    OBJECTIVE IMPAIRMENTS decreased ROM, decreased strength, increased muscle spasms, impaired UE functional use, postural dysfunction, and pain.    ACTIVITY LIMITATIONS cleaning, driving, meal prep, laundry, yard work, and shopping.    PERSONAL FACTORS Fitness, Past/current experiences, and Time since onset of injury/illness/exacerbation are also affecting patient's functional outcome.    GOALS:   SHORT TERM GOALS= LTGs     LONG TERM GOALS:    LTG Name Target Date Goal status  1 Pt will be Ind in a HEP to maintain achieved LOF. Ind with current HEP Baseline: 06/11/21 Ongoing  2 Pt will report a decrease in cervical pain to 4/10 or less for improved cervical and upper body function and QOL Baseline:8-10/10 06/11/21 Ongoing  3 Pt's cervical ROM will increase: Ext to 60d, L and R lat flexion to 45d, L rotation 70d Baseline:See flow sheets 06/11/21 INITIAL  4 Pt's  FOTO function score will increase to the predicted value of 62%  Baseline: 46% functional status 06/11/21 INITIAL  5 Pt will demonstrate proper sitting posture with decreased cervical pain. 05/06/21=Improving  Baseline: Slouch posture 06/11/21 Ongoing    PLAN: PT FREQUENCY: 2x/week   PT DURATION: 6 weeks   PLANNED INTERVENTIONS: Therapeutic exercises, Therapeutic activity, Patient/Family education, Joint mobilization, Dry Needling, Electrical  stimulation, Cryotherapy, Moist heat, Taping, Traction, Ultrasound, Ionotophoresis /ml Dexamethasone, and Manual therapy   PLAN FOR NEXT SESSION: Assess response to HEP, progress therex as indicated, use of modalities, manual techniques, and TPDN as indicated.   Letitia Libra, PT, DPT, ATC 05/10/21 5:13 PM

## 2021-05-12 NOTE — Therapy (Incomplete)
?OUTPATIENT PHYSICAL THERAPY TREATMENT NOTE ? ? ?Patient Name: Travis Burns ?MRN: [id number] ?DOB:Jun 07, 1990, 31 y.o., male ?Today's Date: 05/12/2021 ? ?PCP: Rick Duff, MD ?REFERRING PROVIDER: Dene Gentry, MD ? ? ? ? ? ? ? ?Past Medical History:  ?Diagnosis Date  ? ANHEDONIA 11/05/2009  ? Asthma   ? Broken arm   ? left  ? Bronchitis 01/31/2012  ? Dehydration 03/30/2012  ? Dermatitis 01/31/2012  ? Genital warts 06/29/2010  ? HIDRADENITIS SUPPURATIVA 03/23/2010  ? Other urethritis(597.89) 10/02/2009  ? Penile rash 04/2013  ? Dermatitis: nummular eczema vs Lichen planus (cutivate rx'd by derm)  ? Shoulder pain, right 03/30/2012  ? ?Past Surgical History:  ?Procedure Laterality Date  ? TREATMENT FISTULA ANAL    ? ?Patient Active Problem List  ? Diagnosis Date Noted  ? Cervical radiculopathy 03/29/2021  ? Trapezius muscle strain, left, subsequent encounter 03/12/2021  ? Tinea corporis 07/28/2013  ? Internal and external thrombosed hemorrhoids 11/22/2012  ? Pain of right scapula 10/08/2012  ? Penile rash 08/30/2012  ? Dehydration 03/30/2012  ? Shoulder pain, right 03/30/2012  ? Dermatitis 01/31/2012  ? Abdominal pain 10/31/2011  ? 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 Genital warts 06/29/2010  ? HIDRADENITIS SUPPURATIVA 03/23/2010  ? ANHEDONIA 11/05/2009  ? OTHER URETHRITIS 10/02/2009  ? ? ?REFERRING DIAG: M54.2 (ICD-10-CM) - Neck pain ? ?THERAPY DIAG:  ?No diagnosis found. ? ?PERTINENT HISTORY: N/A ? ?PRECAUTIONS: none ? ?SUBJECTIVE:  ?" I'm alright. It's like a 5."  ?PAIN:  ?Are you having pain? Yes ?NPRS scale: 5/10 ?Pain location: posterior neck   ?PAIN TYPE: throbbing ?Pain description: constant  ?Aggravating factors: "waking up" ?Relieving factors: laying down  ?OBJECTIVE:  ?*Unless otherwise noted all objective measures were captured on initial evaluation.  ?  ?DIAGNOSTIC FINDINGS:  ?  ?MRI Cervical 04/06/21 ?FINDINGS: ?Alignment: No significant listhesis. ?  ?Vertebrae: Vertebral body heights are maintained. No marrow  edema. ?No suspicious osseous lesion. ?  ?Cord: No abnormal signal. ?  ?Posterior Fossa, vertebral arteries, paraspinal tissues: ?Unremarkable. ?  ?Disc levels: Small disc bulge at C4-C5. Mild left foraminal stenosis ?at this level. No canal stenosis at any level. ?  ?IMPRESSION: ?Mild degenerative disc disease at C4-C5.  No high-grade stenosis. ?  ?PATIENT SURVEYS:  ?FOTO 46% functional status; predicted 62%. Ospro 68.4 ?05/10/21: 48% function  ?  ?  ?COGNITION: ?Overall cognitive status: Within functional limits for tasks assessed ?           ?SENSATION: ?Light touch: Deficits decreased light touch pad of L thumb ?  ?POSTURE:  ?Slouched sitting position with forward head, rounded shoulders, increased kyphosis, decreased lordosis. Pt reports this is the position he is most comfortable in. Sitting with better posture increases the pt's L neck and shoulder pain. ?  ?PALPATION: ?TTP to the bilat upper trap and levator L>R. Pressure to L upper trap TP reduces the pt's L thumb numbness.   ?  ?CERVICAL AROM/PROM ?  ?A/PROM A/PROM (deg) ?04/22/2021 05/03/21  ?Flexion 60 increased  L cervical and L thumb tingling   ?Extension 50 ?increased  L cervical and L thumb tingling   ?Right lateral flexion 40 ?increased  R and L cervical and L thumb tingling 40  ?Left lateral flexion 30 ?increased  L cervical and L thumb tingling 35  ?Right rotation 70 ?increased  L cervical and L thumb tingling   ?Left rotation 50 ?increased  L cervical and L thumb tingling   ? (Blank rows = not tested) ?  ?UE AROM/PROM: ?  ?  UE AROMs are WNLs and equal to each other ?  ?UE MMT: ?          Myotomal screen was negative c 5/5 strength demonstrated bilat  ?  ?CERVICAL SPECIAL TESTS:  ?Spurlings test was positive L. Distraction was positive for decreasing L cervical pain. ?  ?TODAY'S TREATMENT:  ?[facility] PT Treatment:                                                DATE: 05/13/21 ?Therapeutic Exercise: ?*** ?Manual Therapy: ?*** ?Neuromuscular  re-ed: ?*** ?Therapeutic Activity: ?*** ?Modalities: ?*** ?Self Care: ?***= ? ? ?[facility] Adult PT Treatment:                                                DATE: 05/10/21 ?Therapeutic Exercise: ?UBE level 2 x 2 min each fwd/bwd  ?Resisted shoulder extension red band 2 x 15  ?Resisted rows 2 x 15 blue band  ?Cervical retraction with orange ball in standing 2 x 10; 5 sec hold  ?Cervical retraction with rotation into orange ball 2 x 10  ?Pec doorway stretch 3 x 30 sec  ?Shoulder diagonals 2 x 10 yellow band  ?Prone scapular retraction 2 x 15  ?Prone T 2  x 15  ? ? ? ?[facility] Adult PT Treatment:                                                DATE: 05/06/21 ?Therapeutic Exercise: ?Cervical retraction in supine 1 x 10  ?DNF 1 x10 5" ?Shoulder rolls 1 x 10  ?Standing Scapular retraction 2 x 10  ?Resisted horizontal shoulder abduction red band 2 x 10 ?Bilateral shoulder ER red band 2 x 15  ?Cervical rotation SNAG 1 x 5  bilateral  10" ?Cervical isometrics x5 5' for flex, ext, L and R lat flexion ?UBE L1 2 mins forward/backward ?Updated HEP ? ?[facility] Adult PT Treatment:                                                DATE: 05/03/21 ?Therapeutic Exercise: ?Shoulder rolls 1 x 10  ?Seated Scapular retraction 1 x 10  ?Cervical retraction in supine 2 x 10  ?Supine scapular retraction 2 x 10  ?Resisted horizontal shoulder abduction yellow band 2 x 10 ?Bilateral shoulder ER yellow band 2 x 15  ?Cervical rotation AROM in supine 2 x 10  ?Cervical extension SNAG upper and lower 1 x 10  ?Cervical rotation SNAG 1 x 10 bilateral  ? ? ? ? ?[place] Adult PT Treatment:                                                DATE: 04/29/21 ?Therapeutic Exercise: ?Cervical isometric strengthening for flex, ext, L and R lat flexion x5 5" hold each ?Manual  Therapy: ?STM/DTM to the bilat suboccipitals, cervical paraspinals, upper trap and levator ?Self Care: ?Updated HEP ?  ?PATIENT EDUCATION:  ?Education details: n/a ?Person educated: n/a ?Education method: n/a ?Education  comprehension: n/a ?  ?  ?HOME EXERCISE PROGRAM: ?Access Code: [email or web address] ?URL: [email or web address] ?Date: 05/07/2021 ?Prepared by: Gar Ponto ? ?Exercises ?Seated Upper Trapezius Stretch - 1 x daily - 7 x weekly - 1 sets - 3 reps - 20 hold ?Gentle Levator Scapulae Stretch - 1 x daily - 7 x weekly - 1 sets - 3 reps - 20 hold ?Standing Isometric Cervical Flexion with Manual Resistance - 2 x daily - 7 x weekly - 1 sets - 5 reps - 5 hold ?Standing Isometric Cervical Sidebending with Manual Resistance - 2 x daily - 7 x weekly - 1 sets - 5 reps - 5 hold ?Standing Isometric Cervical Extension with Manual Resistance - 2 x daily - 7 x weekly - 1 sets - 5 reps - 5 hold ?Seated Assisted Cervical Rotation with Towel - 1 x daily - 7 x weekly - 1 sets - 5 reps - 10 hold ?Supine DNF Liftoffs - 1 x daily - 7 x weekly - 1 sets - 5 reps - 5 hold ? ?  ?  ?ASSESSMENT: ?  ?CLINICAL IMPRESSION: ?Patient continues to report improvement in his neck pain. FOTO score has modestly improved compared to initial evaluation. Continued with periscapular and cervical strengthening with patient requiring moderate postural cues throughout session. No increased pain reported throughout session.  ?  ?OBJECTIVE IMPAIRMENTS decreased ROM, decreased strength, increased muscle spasms, impaired UE functional use, postural dysfunction, and pain.  ?  ?ACTIVITY LIMITATIONS cleaning, driving, meal prep, laundry, yard work, and shopping.  ?  ?PERSONAL FACTORS Fitness, Past/current experiences, and Time since onset of injury/illness/exacerbation are also affecting patient's functional outcome.  ?  ?GOALS: ?  ?SHORT TERM GOALS= LTGs ?  ?  ?LONG TERM GOALS:  ?  ?LTG Name Target Date Goal status  ?1 Pt will be Ind in a HEP to maintain achieved LOF. Ind with current HEP ?Baseline: 06/11/21 Ongoing  ?2 Pt will report a decrease in cervical pain to 4/10 or less for improved cervical and upper body function and QOL ?Baseline:8-10/10 06/11/21 Ongoing  ?3  Pt's cervical ROM will increase: Ext to 60d, L and R lat flexion to 45d, L rotation 70d ?Baseline:See flow sheets 06/11/21 INITIAL  ?4 Pt's FOTO function score will increase to the predicted value of 62%  ?Baseline: 46% f

## 2021-05-13 ENCOUNTER — Ambulatory Visit: Payer: Self-pay

## 2021-05-17 ENCOUNTER — Ambulatory Visit: Payer: Self-pay

## 2021-05-17 NOTE — Therapy (Incomplete)
?OUTPATIENT PHYSICAL THERAPY TREATMENT NOTE ? ? ?Patient Name: Travis Burns ?MRN: [id number] ?DOB:1990/05/07, 31 y.o., male ?Today's Date: 05/17/2021 ? ?PCP: Marolyn Haller, MD ?REFERRING PROVIDER: Marolyn Haller, MD ? ? ? ? ? ? ? ?Past Medical History:  ?Diagnosis Date  ? ANHEDONIA 11/05/2009  ? Asthma   ? Broken arm   ? left  ? Bronchitis 01/31/2012  ? Dehydration 03/30/2012  ? Dermatitis 01/31/2012  ? Genital warts 06/29/2010  ? HIDRADENITIS SUPPURATIVA 03/23/2010  ? Other urethritis(597.89) 10/02/2009  ? Penile rash 04/2013  ? Dermatitis: nummular eczema vs Lichen planus (cutivate rx'd by derm)  ? Shoulder pain, right 03/30/2012  ? ?Past Surgical History:  ?Procedure Laterality Date  ? TREATMENT FISTULA ANAL    ? ?Patient Active Problem List  ? Diagnosis Date Noted  ? Cervical radiculopathy 03/29/2021  ? Trapezius muscle strain, left, subsequent encounter 03/12/2021  ? Tinea corporis 07/28/2013  ? Internal and external thrombosed hemorrhoids 11/22/2012  ? Pain of right scapula 10/08/2012  ? Penile rash 08/30/2012  ? Dehydration 03/30/2012  ? Shoulder pain, right 03/30/2012  ? Dermatitis 01/31/2012  ? Abdominal pain 10/31/2011  ? 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 Genital warts 06/29/2010  ? HIDRADENITIS SUPPURATIVA 03/23/2010  ? ANHEDONIA 11/05/2009  ? OTHER URETHRITIS 10/02/2009  ? ? ?REFERRING DIAG: M54.2 (ICD-10-CM) - Neck pain ? ?THERAPY DIAG:  ?No diagnosis found. ? ?PERTINENT HISTORY: N/A ? ?PRECAUTIONS: none ? ?SUBJECTIVE:  ? ?PAIN:  ?Are you having pain? Yes ?NPRS scale: 5/10 ?Pain location: posterior neck   ?PAIN TYPE: throbbing ?Pain description: constant  ?Aggravating factors: "waking up" ?Relieving factors: laying down  ?OBJECTIVE:  ?*Unless otherwise noted all objective measures were captured on initial evaluation.  ?  ?DIAGNOSTIC FINDINGS:  ?  ?MRI Cervical 04/06/21 ?FINDINGS: ?Alignment: No significant listhesis. ?  ?Vertebrae: Vertebral body heights are maintained. No marrow edema. ?No suspicious osseous  lesion. ?  ?Cord: No abnormal signal. ?  ?Posterior Fossa, vertebral arteries, paraspinal tissues: ?Unremarkable. ?  ?Disc levels: Small disc bulge at C4-C5. Mild left foraminal stenosis ?at this level. No canal stenosis at any level. ?  ?IMPRESSION: ?Mild degenerative disc disease at C4-C5.  No high-grade stenosis. ?  ?PATIENT SURVEYS:  ?FOTO 46% functional status; predicted 62%. Ospro 68.4 ?05/10/21: 48% function  ?  ?  ?COGNITION: ?Overall cognitive status: Within functional limits for tasks assessed ?           ?SENSATION: ?Light touch: Deficits decreased light touch pad of L thumb ?  ?POSTURE:  ?Slouched sitting position with forward head, rounded shoulders, increased kyphosis, decreased lordosis. Pt reports this is the position he is most comfortable in. Sitting with better posture increases the pt's L neck and shoulder pain. ?  ?PALPATION: ?TTP to the bilat upper trap and levator L>R. Pressure to L upper trap TP reduces the pt's L thumb numbness.   ?  ?CERVICAL AROM/PROM ?  ?A/PROM A/PROM (deg) ?04/22/2021 05/03/21  ?Flexion 60 increased  L cervical and L thumb tingling   ?Extension 50 ?increased  L cervical and L thumb tingling   ?Right lateral flexion 40 ?increased  R and L cervical and L thumb tingling 40  ?Left lateral flexion 30 ?increased  L cervical and L thumb tingling 35  ?Right rotation 70 ?increased  L cervical and L thumb tingling   ?Left rotation 50 ?increased  L cervical and L thumb tingling   ? (Blank rows = not tested) ?  ?UE AROM/PROM: ?  ?UE AROMs are WNLs and equal  to each other ?  ?UE MMT: ?          Myotomal screen was negative c 5/5 strength demonstrated bilat  ?  ?CERVICAL SPECIAL TESTS:  ?Spurlings test was positive L. Distraction was positive for decreasing L cervical pain. ?  ?TODAY'S TREATMENT:  ?[facility] Adult PT Treatment:                                                DATE: 05/17/21 ?Therapeutic Exercise: ?*** ?Manual Therapy: ?*** ?Neuromuscular re-ed: ?*** ?Therapeutic  Activity: ?*** ?Modalities: ?*** ?Self Care: ?*** ? ? ?[facility] Adult PT Treatment:                                                DATE: 05/10/21 ?Therapeutic Exercise: ?UBE level 2 x 2 min each fwd/bwd  ?Resisted shoulder extension red band 2 x 15  ?Resisted rows 2 x 15 blue band  ?Cervical retraction with orange ball in standing 2 x 10; 5 sec hold  ?Cervical retraction with rotation into orange ball 2 x 10  ?Pec doorway stretch 3 x 30 sec  ?Shoulder diagonals 2 x 10 yellow band  ?Prone scapular retraction 2 x 15  ?Prone T 2  x 15  ? ? ? ?[facility]                                                [facility]: [facility] ?Therapeutic Exercise: ?Cervical retraction in supine 1 x 10  ?DNF 1 x10 5" ?Shoulder rolls 1 x 10  ?Standing Scapular retraction 2 x 10  ?Resisted horizontal shoulder abduction red band 2 x 10 ?Bilateral shoulder ER red band 2 x 15  ?Cervical rotation SNAG 1 x 5  bilateral  10" ?Cervical isometrics x5 5' for flex, ext, L and R lat flexion ?UBE L1 2 mins forward/backward ?Updated HEP ? ?[facility]                                                DATE: 05/03/21 ?Therapeutic Exercise: ?Shoulder rolls 1 x 10  ?Seated Scapular retraction 1 x 10  ?Cervical retraction in supine 2 x 10  ?Supine scapular retraction 2 x 10  ?Resisted horizontal shoulder abduction yellow band 2 x 10 ?Bilateral shoulder ER yellow band 2 x 15  ?Cervical rotation AROM in supine 2 x 10  ?Cervical extension SNAG upper and lower 1 x 10  ?Cervical rotation SNAG 1 x 10 bilateral  ? ? ? ? ?PATIENT EDUCATION:  ?Education details: n/a ?Person educated: n/a ?Education method: n/a ?Education comprehension: n/a ?  ?  ?HOME EXERCISE PROGRAM: ?Access Code: [email or web address] ?URL: [email or web address] ?Date: 05/07/2021 ?Prepared by: Joellyn RuedAllen Ralls ? ?Exercises ?Seated Upper Trapezius Stretch - 1 x daily - 7 x weekly - 1 sets - 3 reps - 20 hold ?Gentle Levator Scapulae Stretch - 1 x daily - 7 x weekly - 1 sets - 3 reps - 20  hold ?Standing Isometric Cervical Flexion with Manual Resistance - 2 x daily - 7 x weekly - 1 sets - 5 reps - 5 hold ?Standing Isometric Cervical Sidebending with Manual Resistance - 2 x daily - 7 x weekly - 1 sets - 5 reps - 5 hold ?Standing Isometric Cervical Extension with Manual Resistance - 2 x daily - 7 x weekly - 1 sets - 5 reps - 5 hold ?Seated Assisted Cervical Rotation with Towel - 1 x daily - 7 x weekly - 1 sets - 5 reps - 10 hold ?Supine DNF Liftoffs - 1 x daily - 7 x weekly - 1 sets - 5 reps - 5 hold ? ?  ?  ?ASSESSMENT: ?  ?CLINICAL IMPRESSION: ? ?  ?OBJECTIVE IMPAIRMENTS decreased ROM, decreased strength, increased muscle spasms, impaired UE functional use, postural dysfunction, and pain.  ?  ?ACTIVITY LIMITATIONS cleaning, driving, meal prep, laundry, yard work, and shopping.  ?  ?PERSONAL FACTORS Fitness, Past/current experiences, and Time since onset of injury/illness/exacerbation are also affecting patient's functional outcome.  ?  ?GOALS: ?  ?SHORT TERM GOALS= LTGs ?  ?  ?LONG TERM GOALS:  ?  ?LTG Name Target Date Goal status  ?1 Pt will be Ind in a HEP to maintain achieved LOF. Ind with current HEP ?Baseline: 06/11/21 Ongoing  ?2 Pt will report a decrease in cervical pain to 4/10 or less for improved cervical and upper body function and QOL ?Baseline:8-10/10 06/11/21 Ongoing  ?3 Pt's cervical ROM will increase: Ext to 60d, L and R lat flexion to 45d, L rotation 70d ?Baseline:See flow sheets 06/11/21 INITIAL  ?4 Pt's FOTO function score will increase to the predicted value of 62%  ?Baseline: 46% functional status 06/11/21 INITIAL  ?5 Pt will demonstrate proper sitting posture with decreased cervical pain. 05/06/21=Improving  ?Baseline: Slouch posture 06/11/21 Ongoing  ?  ?PLAN: ?PT FREQUENCY: 2x/week ?  ?PT DURATION: 6 weeks ?  ?PLANNED INTERVENTIONS: Therapeutic exercises, Therapeutic activity, Patient/Family education, Joint mobilization, Dry Needling, Electrical stimulation, Cryotherapy, Moist heat,  Taping, Traction, Ultrasound, Ionotophoresis 4mg /ml Dexamethasone, and Manual therapy ?  ?PLAN FOR NEXT SESSION: Assess response to HEP, progress therex as indicated, use of modalities, manual techniques, and TPDN as indicated.  ? ?S

## 2021-05-20 ENCOUNTER — Telehealth: Payer: Self-pay

## 2021-05-20 ENCOUNTER — Ambulatory Visit: Payer: Self-pay

## 2021-05-20 NOTE — Telephone Encounter (Signed)
LVM re: multiple cancellations and attendance policy. Pt was advised that he can only schedule 1 appt at a time and future day of cancellations will result in DC.  ?

## 2021-05-20 NOTE — Therapy (Incomplete)
?OUTPATIENT PHYSICAL THERAPY TREATMENT NOTE ? ? ?Patient Name: Travis Burns ?MRN: [id number] ?DOB:07/26/1990, 31 y.o., male ?Today's Date: 05/20/2021 ? ?PCP: Marolyn Haller, MD ?REFERRING PROVIDER: Lenda Kelp, MD ? ? ? ? ? ? ? ?Past Medical History:  ?Diagnosis Date  ? ANHEDONIA 11/05/2009  ? Asthma   ? Broken arm   ? left  ? Bronchitis 01/31/2012  ? Dehydration 03/30/2012  ? Dermatitis 01/31/2012  ? Genital warts 06/29/2010  ? HIDRADENITIS SUPPURATIVA 03/23/2010  ? Other urethritis(597.89) 10/02/2009  ? Penile rash 04/2013  ? Dermatitis: nummular eczema vs Lichen planus (cutivate rx'd by derm)  ? Shoulder pain, right 03/30/2012  ? ?Past Surgical History:  ?Procedure Laterality Date  ? TREATMENT FISTULA ANAL    ? ?Patient Active Problem List  ? Diagnosis Date Noted  ? Cervical radiculopathy 03/29/2021  ? Trapezius muscle strain, left, subsequent encounter 03/12/2021  ? Tinea corporis 07/28/2013  ? Internal and external thrombosed hemorrhoids 11/22/2012  ? Pain of right scapula 10/08/2012  ? Penile rash 08/30/2012  ? Dehydration 03/30/2012  ? Shoulder pain, right 03/30/2012  ? Dermatitis 01/31/2012  ? Abdominal pain 10/31/2011  ? 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 Genital warts 06/29/2010  ? HIDRADENITIS SUPPURATIVA 03/23/2010  ? ANHEDONIA 11/05/2009  ? OTHER URETHRITIS 10/02/2009  ? ? ?REFERRING DIAG: M54.2 (ICD-10-CM) - Neck pain ? ?THERAPY DIAG:  ?No diagnosis found. ? ?PERTINENT HISTORY: N/A ? ?PRECAUTIONS: none ? ?SUBJECTIVE:  ?" I'm alright. It's like a 5."  ?PAIN:  ?Are you having pain? Yes ?NPRS scale: 5/10 ?Pain location: posterior neck   ?PAIN TYPE: throbbing ?Pain description: constant  ?Aggravating factors: "waking up" ?Relieving factors: laying down  ?OBJECTIVE:  ?*Unless otherwise noted all objective measures were captured on initial evaluation.  ?  ?DIAGNOSTIC FINDINGS:  ?  ?MRI Cervical 04/06/21 ?FINDINGS: ?Alignment: No significant listhesis. ?  ?Vertebrae: Vertebral body heights are maintained. No marrow  edema. ?No suspicious osseous lesion. ?  ?Cord: No abnormal signal. ?  ?Posterior Fossa, vertebral arteries, paraspinal tissues: ?Unremarkable. ?  ?Disc levels: Small disc bulge at C4-C5. Mild left foraminal stenosis ?at this level. No canal stenosis at any level. ?  ?IMPRESSION: ?Mild degenerative disc disease at C4-C5.  No high-grade stenosis. ?  ?PATIENT SURVEYS:  ?FOTO 46% functional status; predicted 62%. Ospro 68.4 ?05/10/21: 48% function  ?  ?  ?COGNITION: ?Overall cognitive status: Within functional limits for tasks assessed ?           ?SENSATION: ?Light touch: Deficits decreased light touch pad of L thumb ?  ?POSTURE:  ?Slouched sitting position with forward head, rounded shoulders, increased kyphosis, decreased lordosis. Pt reports this is the position he is most comfortable in. Sitting with better posture increases the pt's L neck and shoulder pain. ?  ?PALPATION: ?TTP to the bilat upper trap and levator L>R. Pressure to L upper trap TP reduces the pt's L thumb numbness.   ?  ?CERVICAL AROM/PROM ?  ?A/PROM A/PROM (deg) ?04/22/2021 05/03/21  ?Flexion 60 increased  L cervical and L thumb tingling   ?Extension 50 ?increased  L cervical and L thumb tingling   ?Right lateral flexion 40 ?increased  R and L cervical and L thumb tingling 40  ?Left lateral flexion 30 ?increased  L cervical and L thumb tingling 35  ?Right rotation 70 ?increased  L cervical and L thumb tingling   ?Left rotation 50 ?increased  L cervical and L thumb tingling   ? (Blank rows = not tested) ?  ?UE AROM/PROM: ?  ?  UE AROMs are WNLs and equal to each other ?  ?UE MMT: ?          Myotomal screen was negative c 5/5 strength demonstrated bilat  ?  ?CERVICAL SPECIAL TESTS:  ?Spurlings test was positive L. Distraction was positive for decreasing L cervical pain. ?  ?TODAY'S TREATMENT:  ?[facility] Adult PT Treatment:                                                DATE: 05/20/21 ?Therapeutic Exercise: ?*** ?Manual Therapy: ?*** ?Neuromuscular  re-ed: ?*** ?Therapeutic Activity: ?*** ?Modalities: ?*** ?Self Care: ?***= ? ? ?[facility] Adult PT Treatment:                                                DATE: 05/10/21 ?Therapeutic Exercise: ?UBE level 2 x 2 min each fwd/bwd  ?Resisted shoulder extension red band 2 x 15  ?Resisted rows 2 x 15 blue band  ?Cervical retraction with orange ball in standing 2 x 10; 5 sec hold  ?Cervical retraction with rotation into orange ball 2 x 10  ?Pec doorway stretch 3 x 30 sec  ?Shoulder diagonals 2 x 10 yellow band  ?Prone scapular retraction 2 x 15  ?Prone T 2  x 15  ? ? ? ?[facility] Adult PT Treatment:                                                DATE: 05/06/21 ?Therapeutic Exercise: ?Cervical retraction in supine 1 x 10  ?DNF 1 x10 5" ?Shoulder rolls 1 x 10  ?Standing Scapular retraction 2 x 10  ?Resisted horizontal shoulder abduction red band 2 x 10 ?Bilateral shoulder ER red band 2 x 15  ?Cervical rotation SNAG 1 x 5  bilateral  10" ?Cervical isometrics x5 5' for flex, ext, L and R lat flexion ?UBE L1 2 mins forward/backward ?Updated HEP ? ?[facility] Adult PT Treatment:                                                DATE: 05/03/21 ?Therapeutic Exercise: ?Shoulder rolls 1 x 10  ?Seated Scapular retraction 1 x 10  ?Cervical retraction in supine 2 x 10  ?Supine scapular retraction 2 x 10  ?Resisted horizontal shoulder abduction yellow band 2 x 10 ?Bilateral shoulder ER yellow band 2 x 15  ?Cervical rotation AROM in supine 2 x 10  ?Cervical extension SNAG upper and lower 1 x 10  ?Cervical rotation SNAG 1 x 10 bilateral  ? ? ? ? ?[facility] Adult PT Treatment:                                                DATE: 04/29/21 ?Therapeutic Exercise: ?Cervical isometric strengthening for flex, ext, L and R lat flexion x5 5" hold each ?Manual  Therapy: ?STM/DTM to the bilat suboccipitals, cervical paraspinals, upper trap and levator ?Self Care: ?Updated HEP ?  ?PATIENT EDUCATION:  ?Education details: n/a ?Person educated: n/a ?Education method: n/a ?Education  comprehension: n/a ?  ?  ?HOME EXERCISE PROGRAM: ?Access Code: [email or web address] ?URL: [email or web address] ?Date: 05/07/2021 ?Prepared by: Gar Ponto ? ?Exercises ?Seated Upper Trapezius Stretch - 1 x daily - 7 x weekly - 1 sets - 3 reps - 20 hold ?Gentle Levator Scapulae Stretch - 1 x daily - 7 x weekly - 1 sets - 3 reps - 20 hold ?Standing Isometric Cervical Flexion with Manual Resistance - 2 x daily - 7 x weekly - 1 sets - 5 reps - 5 hold ?Standing Isometric Cervical Sidebending with Manual Resistance - 2 x daily - 7 x weekly - 1 sets - 5 reps - 5 hold ?Standing Isometric Cervical Extension with Manual Resistance - 2 x daily - 7 x weekly - 1 sets - 5 reps - 5 hold ?Seated Assisted Cervical Rotation with Towel - 1 x daily - 7 x weekly - 1 sets - 5 reps - 10 hold ?Supine DNF Liftoffs - 1 x daily - 7 x weekly - 1 sets - 5 reps - 5 hold ? ?  ?  ?ASSESSMENT: ?  ?CLINICAL IMPRESSION: ?Patient continues to report improvement in his neck pain. FOTO score has modestly improved compared to initial evaluation. Continued with periscapular and cervical strengthening with patient requiring moderate postural cues throughout session. No increased pain reported throughout session.  ?  ?OBJECTIVE IMPAIRMENTS decreased ROM, decreased strength, increased muscle spasms, impaired UE functional use, postural dysfunction, and pain.  ?  ?ACTIVITY LIMITATIONS cleaning, driving, meal prep, laundry, yard work, and shopping.  ?  ?PERSONAL FACTORS Fitness, Past/current experiences, and Time since onset of injury/illness/exacerbation are also affecting patient's functional outcome.  ?  ?GOALS: ?  ?SHORT TERM GOALS= LTGs ?  ?  ?LONG TERM GOALS:  ?  ?LTG Name Target Date Goal status  ?1 Pt will be Ind in a HEP to maintain achieved LOF. Ind with current HEP ?Baseline: 06/11/21 Ongoing  ?2 Pt will report a decrease in cervical pain to 4/10 or less for improved cervical and upper body function and QOL ?Baseline:8-10/10 06/11/21 Ongoing  ?3  Pt's cervical ROM will increase: Ext to 60d, L and R lat flexion to 45d, L rotation 70d ?Baseline:See flow sheets 06/11/21 INITIAL  ?4 Pt's FOTO function score will increase to the predicted value of 62%  ?Baseline: 46% f

## 2021-05-24 ENCOUNTER — Ambulatory Visit: Payer: Self-pay

## 2021-05-24 ENCOUNTER — Other Ambulatory Visit: Payer: Self-pay

## 2021-05-24 DIAGNOSIS — R252 Cramp and spasm: Secondary | ICD-10-CM

## 2021-05-24 DIAGNOSIS — R293 Abnormal posture: Secondary | ICD-10-CM

## 2021-05-24 DIAGNOSIS — M542 Cervicalgia: Secondary | ICD-10-CM

## 2021-05-24 NOTE — Therapy (Signed)
?OUTPATIENT PHYSICAL THERAPY TREATMENT NOTE ? ? ?Patient Name: Travis Burns ?MRN: [id number] ?DOB:09/25/1990, 31 y.o., male ?Today's Date: 05/24/2021 ? ?PCP: Marolyn Haller, MD ?REFERRING PROVIDER: Marolyn Haller, MD ? ? PT End of Session - 05/24/21 1500   ? ? Visit Number [phone]   ? Number of Visits 13   ? Date for PT Re-Evaluation 06/11/21   ? Authorization Type [employer]   ? PT Start Time 1500   ? PT Stop Time 1540   ? PT Time Calculation (min) 40 min   ? Activity Tolerance Patient tolerated treatment well   ? Behavior During Therapy [facility] for tasks assessed/performed   ? ?  ?  ? ?  ? ? ? ? ? ? ?Past Medical History:  ?Diagnosis Date  ? ANHEDONIA 11/05/2009  ? Asthma   ? Broken arm   ? left  ? Bronchitis 01/31/2012  ? Dehydration 03/30/2012  ? Dermatitis 01/31/2012  ? Genital warts 06/29/2010  ? HIDRADENITIS SUPPURATIVA 03/23/2010  ? Other urethritis(597.89) 10/02/2009  ? Penile rash 04/2013  ? Dermatitis: nummular eczema vs Lichen planus (cutivate rx'd by derm)  ? Shoulder pain, right 03/30/2012  ? ?Past Surgical History:  ?Procedure Laterality Date  ? TREATMENT FISTULA ANAL    ? ?Patient Active Problem List  ? Diagnosis Date Noted  ? Cervical radiculopathy 03/29/2021  ? Trapezius muscle strain, left, subsequent encounter 03/12/2021  ? Tinea corporis 07/28/2013  ? Internal and external thrombosed hemorrhoids 11/22/2012  ? Pain of right scapula 10/08/2012  ? Penile rash 08/30/2012  ? Dehydration 03/30/2012  ? Shoulder pain, right 03/30/2012  ? Dermatitis 01/31/2012  ? Abdominal pain 10/31/2011  ? 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 Genital warts 06/29/2010  ? HIDRADENITIS SUPPURATIVA 03/23/2010  ? ANHEDONIA 11/05/2009  ? OTHER URETHRITIS 10/02/2009  ? ? ?REFERRING DIAG: M54.2 (ICD-10-CM) - Neck pain ? ?THERAPY DIAG:  ?Cervicalgia ? ?Cramp and spasm ? ?Abnormal posture ? ?PERTINENT HISTORY: N/A ? ?PRECAUTIONS: none ? ?SUBJECTIVE:  ?"It's an 8. I don't think it's ever going away."  ?PAIN:  ?Are you having pain? Yes ?NPRS scale:  8/10 ?Pain location: posterior neck   ?PAIN TYPE: throbbing ?Pain description: constant  ?Aggravating factors: "waking up" ?Relieving factors: laying down  ?OBJECTIVE:  ?*Unless otherwise noted all objective measures were captured on initial evaluation.  ?  ?DIAGNOSTIC FINDINGS:  ?  ?MRI Cervical 04/06/21 ?FINDINGS: ?Alignment: No significant listhesis. ?  ?Vertebrae: Vertebral body heights are maintained. No marrow edema. ?No suspicious osseous lesion. ?  ?Cord: No abnormal signal. ?  ?Posterior Fossa, vertebral arteries, paraspinal tissues: ?Unremarkable. ?  ?Disc levels: Small disc bulge at C4-C5. Mild left foraminal stenosis ?at this level. No canal stenosis at any level. ?  ?IMPRESSION: ?Mild degenerative disc disease at C4-C5.  No high-grade stenosis. ?  ?PATIENT SURVEYS:  ?FOTO 46% functional status; predicted 62%. Ospro 68.4 ?05/10/21: 48% function  ?  ?  ?COGNITION: ?Overall cognitive status: Within functional limits for tasks assessed ?           ?SENSATION: ?Light touch: Deficits decreased light touch pad of L thumb ?  ?POSTURE:  ?Slouched sitting position with forward head, rounded shoulders, increased kyphosis, decreased lordosis. Pt reports this is the position he is most comfortable in. Sitting with better posture increases the pt's L neck and shoulder pain. ?  ?PALPATION: ?TTP to the bilat upper trap and levator L>R. Pressure to L upper trap TP reduces the pt's L thumb numbness.   ?  ?CERVICAL AROM/PROM ?  ?  A/PROM A/PROM (deg) ?04/22/2021 05/03/21  ?Flexion 60 increased  L cervical and L thumb tingling   ?Extension 50 ?increased  L cervical and L thumb tingling   ?Right lateral flexion 40 ?increased  R and L cervical and L thumb tingling 40  ?Left lateral flexion 30 ?increased  L cervical and L thumb tingling 35  ?Right rotation 70 ?increased  L cervical and L thumb tingling   ?Left rotation 50 ?increased  L cervical and L thumb tingling   ? (Blank rows = not tested) ?  ?UE AROM/PROM: ?  ?UE AROMs are WNLs  and equal to each other ?  ?UE MMT: ?          Myotomal screen was negative c 5/5 strength demonstrated bilat  ?  ?CERVICAL SPECIAL TESTS:  ?Spurlings test was positive L. Distraction was positive for decreasing L cervical pain. ?  ?TODAY'S TREATMENT:  ?[facility] Adult PT Treatment:                                                DATE: 05/24/21 ?Therapeutic Exercise: ?UBE level 3 x 2 min each fwd/ bwd ?Upper trap stretch 2 x 30 sec bilateral  ?Levator scap stretch 2 x 30 sec bilateral  ?Resisted row 2 x 15 @ 65 lbs  ?Resisted horizontal abduction and diagonals 2 x 10 yellow band  ?Therapeutic Activity: ?Pushing weighted sled 30 lbs 2 x 50 ft ?Pulling weighted sled 30 lbs 2 x 50 ft  ?Lifting from floor to waist; 2 x 10; 15 lb kettlebell  ? ? ?[facility] Adult PT Treatment:                                                DATE: 05/10/21 ?Therapeutic Exercise: ?UBE level 2 x 2 min each fwd/bwd  ?Resisted shoulder extension red band 2 x 15  ?Resisted rows 2 x 15 blue band  ?Cervical retraction with orange ball in standing 2 x 10; 5 sec hold  ?Cervical retraction with rotation into orange ball 2 x 10  ?Pec doorway stretch 3 x 30 sec  ?Shoulder diagonals 2 x 10 yellow band  ?Prone scapular retraction 2 x 15  ?Prone T 2  x 15  ? ? ? ?[facility]                                                DATE: 05/06/21 ?Therapeutic Exercise: ?Cervical retraction in supine 1 x 10  ?DNF 1 x10 5" ?Shoulder rolls 1 x 10  ?Standing Scapular retraction 2 x 10  ?Resisted horizontal shoulder abduction red band 2 x 10 ?Bilateral shoulder ER red band 2 x 15  ?Cervical rotation SNAG 1 x 5  bilateral  10" ?Cervical isometrics x5 5' for flex, ext, L and R lat flexion ?UBE L1 2 mins forward/backward ?Updated HEP ? ? ? ? ? ? ?PATIENT EDUCATION:  ?Education details: n/a ?Person educated: n/a ?Education method: n/a ?Education comprehension: n/a ?  ?  ?HOME EXERCISE PROGRAM: ?Access Code: [email or web address] ?URL: [email or web address] ?Date:  05/07/2021 ?Prepared by: Joellyn RuedAllen Ralls ? ?  Exercises ?Seated Upper Trapezius Stretch - 1 x daily - 7 x weekly - 1 sets - 3 reps - 20 hold ?Gentle Levator Scapulae Stretch - 1 x daily - 7 x weekly - 1 sets - 3 reps - 20 hold ?Standing Isometric Cervical Flexion with Manual Resistance - 2 x daily - 7 x weekly - 1 sets - 5 reps - 5 hold ?Standing Isometric Cervical Sidebending with Manual Resistance - 2 x daily - 7 x weekly - 1 sets - 5 reps - 5 hold ?Standing Isometric Cervical Extension with Manual Resistance - 2 x daily - 7 x weekly - 1 sets - 5 reps - 5 hold ?Seated Assisted Cervical Rotation with Towel - 1 x daily - 7 x weekly - 1 sets - 5 reps - 10 hold ?Supine DNF Liftoffs - 1 x daily - 7 x weekly - 1 sets - 5 reps - 5 hold ? ?  ?  ?ASSESSMENT: ?  ?CLINICAL IMPRESSION: ?Patient returns to PT after brief lapse in care secondary to unrelated illness. He reports continued ongoing high levels of neck pain upon arrival. Patient was interested in beginning functional strengthening/work specific activity to determine overall tolerance to these tasks. He is able to complete pushing, pulling, and light weight lifting with good form, though reports an increase in Lt neck pain with all activity that is reduced with rest. Minimal postural cues required with there ex.  ?  ?OBJECTIVE IMPAIRMENTS decreased ROM, decreased strength, increased muscle spasms, impaired UE functional use, postural dysfunction, and pain.  ?  ?ACTIVITY LIMITATIONS cleaning, driving, meal prep, laundry, yard work, and shopping.  ?  ?PERSONAL FACTORS Fitness, Past/current experiences, and Time since onset of injury/illness/exacerbation are also affecting patient's functional outcome.  ?  ?GOALS: ?  ?SHORT TERM GOALS= LTGs ?  ?  ?LONG TERM GOALS:  ?  ?LTG Name Target Date Goal status  ?1 Pt will be Ind in a HEP to maintain achieved LOF. Ind with current HEP ?Baseline: 06/11/21 Ongoing  ?2 Pt will report a decrease in cervical pain to 4/10 or less for  improved cervical and upper body function and QOL ?Baseline:8-10/10 06/11/21 Ongoing  ?3 Pt's cervical ROM will increase: Ext to 60d, L and R lat flexion to 45d, L rotation 70d ?Baseline:See flow sheets 06/11/21 INITIAL

## 2021-05-26 NOTE — Therapy (Signed)
?OUTPATIENT PHYSICAL THERAPY TREATMENT NOTE ? ? ?Patient Name: Travis Burns ?MRN: [id number] ?DOB:1991/01/29, 31 y.o., male ?Today's Date: 05/27/2021 ? ?PCP: Marolyn Haller, MD ?REFERRING PROVIDER: Lenda Kelp, MD ? ? PT End of Session - 05/27/21 2118   ? ? Visit Number 8   ? Number of Visits 13   ? Date for PT Re-Evaluation 06/11/21   ? Authorization Type [employer]   ? Progress Note Due on Visit 10   ? PT Start Time 1630   ? PT Stop Time 1715   ? PT Time Calculation (min) 45 min   ? Activity Tolerance Patient tolerated treatment well   ? Behavior During Therapy [facility] for tasks assessed/performed   ? ?  ?  ? ?  ? ? ? ? ? ? ? ?Past Medical History:  ?Diagnosis Date  ? ANHEDONIA 11/05/2009  ? Asthma   ? Broken arm   ? left  ? Bronchitis 01/31/2012  ? Dehydration 03/30/2012  ? Dermatitis 01/31/2012  ? Genital warts 06/29/2010  ? HIDRADENITIS SUPPURATIVA 03/23/2010  ? Other urethritis(597.89) 10/02/2009  ? Penile rash 04/2013  ? Dermatitis: nummular eczema vs Lichen planus (cutivate rx'd by derm)  ? Shoulder pain, right 03/30/2012  ? ?Past Surgical History:  ?Procedure Laterality Date  ? TREATMENT FISTULA ANAL    ? ?Patient Active Problem List  ? Diagnosis Date Noted  ? Cervical radiculopathy 03/29/2021  ? Trapezius muscle strain, left, subsequent encounter 03/12/2021  ? Tinea corporis 07/28/2013  ? Internal and external thrombosed hemorrhoids 11/22/2012  ? Pain of right scapula 10/08/2012  ? Penile rash 08/30/2012  ? Dehydration 03/30/2012  ? Shoulder pain, right 03/30/2012  ? Dermatitis 01/31/2012  ? Abdominal pain 10/31/2011  ? 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 Genital warts 06/29/2010  ? HIDRADENITIS SUPPURATIVA 03/23/2010  ? ANHEDONIA 11/05/2009  ? OTHER URETHRITIS 10/02/2009  ? ? ?REFERRING DIAG: M54.2 (ICD-10-CM) - Neck pain ? ?THERAPY DIAG:  ?Cervicalgia ? ?Cramp and spasm ? ?Abnormal posture ? ?PERTINENT HISTORY: N/A ? ?PRECAUTIONS: none ? ?SUBJECTIVE: My neck pain is an 8/10 with cervical movements. I want to be  able to return to being more active and working. I think I can tolerate working. I have more pain at rest or sitting. I tolerated the last PT session well. ?  ?PAIN:  ?Are you having pain? Yes ?NPRS scale: 8/10 c neck movements and 5/10 at rest. ?Pain location: posterior neck   ?PAIN TYPE: throbbing ?Pain description: constant  ?Aggravating factors: "waking up" ?Relieving factors: laying down  ?OBJECTIVE:  ?*Unless otherwise noted all objective measures were captured on initial evaluation.  ?  ?DIAGNOSTIC FINDINGS:  ?  ?MRI Cervical 04/06/21 ?FINDINGS: ?Alignment: No significant listhesis. ?  ?Vertebrae: Vertebral body heights are maintained. No marrow edema. ?No suspicious osseous lesion. ?  ?Cord: No abnormal signal. ?  ?Posterior Fossa, vertebral arteries, paraspinal tissues: ?Unremarkable. ?  ?Disc levels: Small disc bulge at C4-C5. Mild left foraminal stenosis ?at this level. No canal stenosis at any level. ?  ?IMPRESSION: ?Mild degenerative disc disease at C4-C5.  No high-grade stenosis. ?  ?PATIENT SURVEYS:  ?FOTO 46% functional status; predicted 62%. Ospro 68.4 ?05/10/21: 48% function  ?  ?  ?COGNITION: ?Overall cognitive status: Within functional limits for tasks assessed ?           ?SENSATION: ?Light touch: Deficits decreased light touch pad of L thumb ?  ?POSTURE:  ?Slouched sitting position with forward head, rounded shoulders, increased kyphosis, decreased lordosis. Pt reports this  is the position he is most comfortable in. Sitting with better posture increases the pt's L neck and shoulder pain. ?  ?PALPATION: ?TTP to the bilat upper trap and levator L>R. Pressure to L upper trap TP reduces the pt's L thumb numbness.   ?  ?CERVICAL AROM/PROM ?  ?A/PROM A/PROM (deg) ?04/22/2021 05/03/21  ?Flexion 60 increased  L cervical and L thumb tingling   ?Extension 50 ?increased  L cervical and L thumb tingling   ?Right lateral flexion 40 ?increased  R and L cervical and L thumb tingling 40  ?Left lateral flexion  30 ?increased  L cervical and L thumb tingling 35  ?Right rotation 70 ?increased  L cervical and L thumb tingling   ?Left rotation 50 ?increased  L cervical and L thumb tingling   ? (Blank rows = not tested) ?  ?UE AROM/PROM: ?  ?UE AROMs are WNLs and equal to each other ?  ?UE MMT: ?          Myotomal screen was negative c 5/5 strength demonstrated bilat  ?  ?CERVICAL SPECIAL TESTS:  ?Spurlings test was positive L. Distraction was positive for decreasing L cervical pain. ?  ?TODAY'S TREATMENT:  ?[facility] Adult PT Treatment:                                                DATE: 05/27/21 ?Therapeutic Exercise: ?UBE level 3 x 2 min each fwd/ bwd ?Upper trap stretch 2 x 30 sec bilateral  ?Levator scap stretch 2 x 30 sec bilateral  ?Resisted row 2 x 15 @ 75 lbs  ?Resisted horizontal abduction and ER 2 x 10 RTB ?Therapeutic Activity: ?Pushing weighted sled 60 lbs 2 x 50 ft x2 ?Pulling weighted sled 60 lbs 2 x 50 ft  x2 ?Lifting from floor to waist; 3 x 10; 25 lb kettlebell  ?Alt single arm waist to shoulder height lifting 8# 2x10  ? ? ?[facility]                                                [facility]: 05[facility] ?Therapeutic Exercise: ?UBE level 3 x 2 min each fwd/ bwd ?Upper trap stretch 2 x 30 sec bilateral  ?Levator scap stretch 2 x 30 sec bilateral  ?Resisted row 2 x 15 @ 65 lbs  ?Resisted horizontal abduction and diagonals 2 x 10 yellow band  ? ?Therapeutic Activity: ?Pushing weighted sled 60 lbs 2 x 50 ft ?Pulling weighted sled 60 lbs 2 x 50 ft  ?Lifting from floor to waist; 2 x 10; 25 lb kettlebell  ? ? ?[facility] Adult PT Treatment:                                                DATE: 05/10/21 ?Therapeutic Exercise: ?UBE level 2 x 2 min each fwd/bwd  ?Resisted shoulder extension red band 2 x 15  ?Resisted rows 2 x 15 blue band  ?Cervical retraction with orange ball in standing 2 x 10; 5 sec hold  ?Cervical retraction with rotation into orange ball 2 x 10  ?  Pec doorway stretch 3 x 30 sec  ?Shoulder diagonals 2 x 10  yellow band  ?Prone scapular retraction 2 x 15  ?Prone T 2  x 15  ? ?PATIENT EDUCATION:  ?Education details: n/a ?Person educated: n/a ?Education method: n/a ?Education comprehension: n/a ?  ?  ?HOME EXERCISE PROGRAM: ?Access Code: [email or web address] ?URL: [email or web address] ?Date: 05/07/2021 ?Prepared by: Joellyn Rued ? ?Exercises ?Seated Upper Trapezius Stretch - 1 x daily - 7 x weekly - 1 sets - 3 reps - 20 hold ?Gentle Levator Scapulae Stretch - 1 x daily - 7 x weekly - 1 sets - 3 reps - 20 hold ?Standing Isometric Cervical Flexion with Manual Resistance - 2 x daily - 7 x weekly - 1 sets - 5 reps - 5 hold ?Standing Isometric Cervical Sidebending with Manual Resistance - 2 x daily - 7 x weekly - 1 sets - 5 reps - 5 hold ?Standing Isometric Cervical Extension with Manual Resistance - 2 x daily - 7 x weekly - 1 sets - 5 reps - 5 hold ?Seated Assisted Cervical Rotation with Towel - 1 x daily - 7 x weekly - 1 sets - 5 reps - 10 hold ?Supine DNF Liftoffs - 1 x daily - 7 x weekly - 1 sets - 5 reps - 5 hold ? ?  ?  ?ASSESSMENT: ?  ?CLINICAL IMPRESSION: ?PT was continued for functional strengthening/work specific activity. The demand for these activities was increased with today's PT session. Pt tolerated this increase without adverse effects at the time of service. Pt indicates his neck pain is worse primarily when he moves his neck. Pt will continue to benefit from skilled PT to optimize the his functional abilities. ?  ?OBJECTIVE IMPAIRMENTS decreased ROM, decreased strength, increased muscle spasms, impaired UE functional use, postural dysfunction, and pain.  ?  ?ACTIVITY LIMITATIONS cleaning, driving, meal prep, laundry, yard work, and shopping.  ?  ?PERSONAL FACTORS Fitness, Past/current experiences, and Time since onset of injury/illness/exacerbation are also affecting patient's functional outcome.  ?  ?GOALS: ?  ?SHORT TERM GOALS= LTGs ?  ?  ?LONG TERM GOALS:  ?  ?LTG Name Target Date Goal status  ?1 Pt  will be Ind in a HEP to maintain achieved LOF. Ind with current HEP ?Baseline: 06/11/21 Ongoing  ?2 Pt will report a decrease in cervical pain to 4/10 or less for improved cervical and upper body function and QOL ?

## 2021-05-27 ENCOUNTER — Ambulatory Visit: Payer: Self-pay

## 2021-05-27 DIAGNOSIS — M542 Cervicalgia: Secondary | ICD-10-CM

## 2021-05-27 DIAGNOSIS — R252 Cramp and spasm: Secondary | ICD-10-CM

## 2021-05-27 DIAGNOSIS — R293 Abnormal posture: Secondary | ICD-10-CM

## 2021-05-29 NOTE — Therapy (Incomplete)
?OUTPATIENT PHYSICAL THERAPY TREATMENT NOTE ? ? ?Patient Name: Travis Burns ?MRN: [id number] ?DOB:29-Nov-1990, 31 y.o., male ?Today's Date: 05/29/2021 ? ?PCP: Marolyn Haller, MD ?REFERRING PROVIDER: Marolyn Haller, MD ? ? ? ? ? ? ? ? ? ?Past Medical History:  ?Diagnosis Date  ? ANHEDONIA 11/05/2009  ? Asthma   ? Broken arm   ? left  ? Bronchitis 01/31/2012  ? Dehydration 03/30/2012  ? Dermatitis 01/31/2012  ? Genital warts 06/29/2010  ? HIDRADENITIS SUPPURATIVA 03/23/2010  ? Other urethritis(597.89) 10/02/2009  ? Penile rash 04/2013  ? Dermatitis: nummular eczema vs Lichen planus (cutivate rx'd by derm)  ? Shoulder pain, right 03/30/2012  ? ?Past Surgical History:  ?Procedure Laterality Date  ? TREATMENT FISTULA ANAL    ? ?Patient Active Problem List  ? Diagnosis Date Noted  ? Cervical radiculopathy 03/29/2021  ? Trapezius muscle strain, left, subsequent encounter 03/12/2021  ? Tinea corporis 07/28/2013  ? Internal and external thrombosed hemorrhoids 11/22/2012  ? Pain of right scapula 10/08/2012  ? Penile rash 08/30/2012  ? Dehydration 03/30/2012  ? Shoulder pain, right 03/30/2012  ? Dermatitis 01/31/2012  ? Abdominal pain 10/31/2011  ? 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 Genital warts 06/29/2010  ? HIDRADENITIS SUPPURATIVA 03/23/2010  ? ANHEDONIA 11/05/2009  ? OTHER URETHRITIS 10/02/2009  ? ? ?REFERRING DIAG: M54.2 (ICD-10-CM) - Neck pain ? ?THERAPY DIAG:  ?No diagnosis found. ? ?PERTINENT HISTORY: N/A ? ?PRECAUTIONS: none ? ?SUBJECTIVE: *** ?  ?PAIN:  ?Are you having pain? Yes ?NPRS scale: 8/10 c neck movements and 5/10 at rest. ?Pain location: posterior neck   ?PAIN TYPE: throbbing ?Pain description: constant  ?Aggravating factors: "waking up" ?Relieving factors: laying down  ?OBJECTIVE:  ?*Unless otherwise noted all objective measures were captured on initial evaluation.  ?  ?DIAGNOSTIC FINDINGS:  ?  ?MRI Cervical 04/06/21 ?FINDINGS: ?Alignment: No significant listhesis. ?  ?Vertebrae: Vertebral body heights are maintained. No  marrow edema. ?No suspicious osseous lesion. ?  ?Cord: No abnormal signal. ?  ?Posterior Fossa, vertebral arteries, paraspinal tissues: ?Unremarkable. ?  ?Disc levels: Small disc bulge at C4-C5. Mild left foraminal stenosis ?at this level. No canal stenosis at any level. ?  ?IMPRESSION: ?Mild degenerative disc disease at C4-C5.  No high-grade stenosis. ?  ?PATIENT SURVEYS:  ?FOTO 46% functional status; predicted 62%. Ospro 68.4 ?05/10/21: 48% function  ?  ?  ?COGNITION: ?Overall cognitive status: Within functional limits for tasks assessed ?           ?SENSATION: ?Light touch: Deficits decreased light touch pad of L thumb ?  ?POSTURE:  ?Slouched sitting position with forward head, rounded shoulders, increased kyphosis, decreased lordosis. Pt reports this is the position he is most comfortable in. Sitting with better posture increases the pt's L neck and shoulder pain. ?  ?PALPATION: ?TTP to the bilat upper trap and levator L>R. Pressure to L upper trap TP reduces the pt's L thumb numbness.   ?  ?CERVICAL AROM/PROM ?  ?A/PROM A/PROM (deg) ?04/22/2021 05/03/21  ?Flexion 60 increased  L cervical and L thumb tingling   ?Extension 50 ?increased  L cervical and L thumb tingling   ?Right lateral flexion 40 ?increased  R and L cervical and L thumb tingling 40  ?Left lateral flexion 30 ?increased  L cervical and L thumb tingling 35  ?Right rotation 70 ?increased  L cervical and L thumb tingling   ?Left rotation 50 ?increased  L cervical and L thumb tingling   ? (Blank rows = not tested) ?  ?  UE AROM/PROM: ?  ?UE AROMs are WNLs and equal to each other ?  ?UE MMT: ?          Myotomal screen was negative c 5/5 strength demonstrated bilat  ?  ?CERVICAL SPECIAL TESTS:  ?Spurlings test was positive L. Distraction was positive for decreasing L cervical pain. ?  ?TODAY'S TREATMENT:  ? ?[facility] Adult PT Treatment:                                                DATE: 05/31/2021 ?Therapeutic Exercise: ?*** ?Manual Therapy: ?*** ?Neuromuscular  re-ed: ?*** ?Therapeutic Activity: ?*** ?Modalities: ?*** ?Self Care: ?*** ? ? ?[facility] Adult PT Treatment:                                                DATE: 05/27/21 ?Therapeutic Exercise: ?UBE level 3 x 2 min each fwd/ bwd ?Upper trap stretch 2 x 30 sec bilateral  ?Levator scap stretch 2 x 30 sec bilateral  ?Resisted row 2 x 15 @ 75 lbs  ?Resisted horizontal abduction and ER 2 x 10 RTB ?Therapeutic Activity: ?Pushing weighted sled 60 lbs 2 x 50 ft x2 ?Pulling weighted sled 60 lbs 2 x 50 ft  x2 ?Lifting from floor to waist; 3 x 10; 25 lb kettlebell  ?Alt single arm waist to shoulder height lifting 8# 2x10  ? ? ?[facility] Adult PT Treatment:                                                DATE: 05/24/21 ?Therapeutic Exercise: ?UBE level 3 x 2 min each fwd/ bwd ?Upper trap stretch 2 x 30 sec bilateral  ?Levator scap stretch 2 x 30 sec bilateral  ?Resisted row 2 x 15 @ 65 lbs  ?Resisted horizontal abduction and diagonals 2 x 10 yellow band  ? ?Therapeutic Activity: ?Pushing weighted sled 60 lbs 2 x 50 ft ?Pulling weighted sled 60 lbs 2 x 50 ft  ?Lifting from floor to waist; 2 x 10; 25 lb kettlebell  ? ? ? ?PATIENT EDUCATION:  ?Education details: n/a ?Person educated: n/a ?Education method: n/a ?Education comprehension: n/a ?  ?  ?HOME EXERCISE PROGRAM: ?Access Code: [email or web address] ?URL: [email or web address] ?Date: 05/07/2021 ?Prepared by: Joellyn Rued ? ?Exercises ?Seated Upper Trapezius Stretch - 1 x daily - 7 x weekly - 1 sets - 3 reps - 20 hold ?Gentle Levator Scapulae Stretch - 1 x daily - 7 x weekly - 1 sets - 3 reps - 20 hold ?Standing Isometric Cervical Flexion with Manual Resistance - 2 x daily - 7 x weekly - 1 sets - 5 reps - 5 hold ?Standing Isometric Cervical Sidebending with Manual Resistance - 2 x daily - 7 x weekly - 1 sets - 5 reps - 5 hold ?Standing Isometric Cervical Extension with Manual Resistance - 2 x daily - 7 x weekly - 1 sets - 5 reps - 5 hold ?Seated Assisted Cervical Rotation with Towel - 1 x  daily - 7 x weekly - 1 sets - 5 reps - 10 hold ?Supine DNF Liftoffs - 1  x daily - 7 x weekly - 1 sets - 5 reps - 5 hold ? ?  ?  ?ASSESSMENT: ?  ?CLINICAL IMPRESSION: ?*** ?  ?OBJECTIVE IMPAIRMENTS decreased ROM, decreased strength, increased muscle spasms, impaired UE functional use, postural dysfunction, and pain.  ?  ?ACTIVITY LIMITATIONS cleaning, driving, meal prep, laundry, yard work, and shopping.  ?  ?PERSONAL FACTORS Fitness, Past/current experiences, and Time since onset of injury/illness/exacerbation are also affecting patient's functional outcome.  ?  ?GOALS: ?  ?SHORT TERM GOALS= LTGs ?  ?  ?LONG TERM GOALS:  ?  ?LTG Name Target Date Goal status  ?1 Pt will be Ind in a HEP to maintain achieved LOF. Ind with current HEP ?Baseline: 06/11/21 Ongoing  ?2 Pt will report a decrease in cervical pain to 4/10 or less for improved cervical and upper body function and QOL ?Baseline:8-10/10 06/11/21 Ongoing  ?3 Pt's cervical ROM will increase: Ext to 60d, L and R lat flexion to 45d, L rotation 70d ?Baseline:See flow sheets 06/11/21 INITIAL  ?4 Pt's FOTO function score will increase to the predicted value of 62%  ?Baseline: 46% functional status 06/11/21 INITIAL  ?5 Pt will demonstrate proper sitting posture with decreased cervical pain. 05/06/21=Improving  ?Baseline: Slouch posture 06/11/21 Ongoing  ?  ?PLAN: ?PT FREQUENCY: 2x/week ?  ?PT DURATION: 6 weeks ?  ?PLANNED INTERVENTIONS: Therapeutic exercises, Therapeutic activity, Patient/Family education, Joint mobilization, Dry Needling, Electrical stimulation, Cryotherapy, Moist heat, Taping, Traction, Ultrasound, Ionotophoresis 4mg /ml Dexamethasone, and Manual therapy ?  ?PLAN FOR NEXT SESSION: update HEP PRN, progress therex as indicated, use of modalities, manual techniques, and TPDN as indicated, functional/work related activities  ? ?Carmelina DaneYarborough, Heaton Sarin, PT, DPT ?05/29/21 10:26 AM ? ? ? ? ? ?

## 2021-05-31 ENCOUNTER — Ambulatory Visit: Payer: Self-pay | Attending: Family Medicine

## 2021-05-31 ENCOUNTER — Telehealth: Payer: Self-pay

## 2021-05-31 DIAGNOSIS — M542 Cervicalgia: Secondary | ICD-10-CM | POA: Insufficient documentation

## 2021-05-31 DIAGNOSIS — R252 Cramp and spasm: Secondary | ICD-10-CM | POA: Insufficient documentation

## 2021-05-31 DIAGNOSIS — R293 Abnormal posture: Secondary | ICD-10-CM | POA: Insufficient documentation

## 2021-05-31 NOTE — Telephone Encounter (Signed)
Spoke with pt regarding his first no show. Confirmed his next appointment. He reports understanding. ?

## 2021-06-02 NOTE — Therapy (Incomplete)
?OUTPATIENT PHYSICAL THERAPY TREATMENT NOTE ? ? ?Patient Name: Travis AxDavid Burns ?MRN: [id number] ?DOB:Aug 05, 1990, 31 y.o., male ?Today's Date: 06/02/2021 ? ?PCP: Marolyn Hallerarter, [place], MD ?REFERRING PROVIDER: Lenda KelpHudnall, Shane R, MD ? ? ? ? ? ? ? ? ? ?Past Medical History:  ?Diagnosis Date  ? ANHEDONIA 11/05/2009  ? Asthma   ? Broken arm   ? left  ? Bronchitis 01/31/2012  ? Dehydration 03/30/2012  ? Dermatitis 01/31/2012  ? Genital warts 06/29/2010  ? HIDRADENITIS SUPPURATIVA 03/23/2010  ? Other urethritis(597.89) 10/02/2009  ? Penile rash 04/2013  ? Dermatitis: nummular eczema vs Lichen planus (cutivate rx'd by derm)  ? Shoulder pain, right 03/30/2012  ? ?Past Surgical History:  ?Procedure Laterality Date  ? TREATMENT FISTULA ANAL    ? ?Patient Active Problem List  ? Diagnosis Date Noted  ? Cervical radiculopathy 03/29/2021  ? Trapezius muscle strain, left, subsequent encounter 03/12/2021  ? Tinea corporis 07/28/2013  ? Internal and external thrombosed hemorrhoids 11/22/2012  ? Pain of right scapula 10/08/2012  ? Penile rash 08/30/2012  ? Dehydration 03/30/2012  ? Shoulder pain, right 03/30/2012  ? Dermatitis 01/31/2012  ? Abdominal pain 10/31/2011  ? 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 Genital warts 06/29/2010  ? HIDRADENITIS SUPPURATIVA 03/23/2010  ? ANHEDONIA 11/05/2009  ? OTHER URETHRITIS 10/02/2009  ? ? ?REFERRING DIAG: M54.2 (ICD-10-CM) - Neck pain ? ?THERAPY DIAG:  ?No diagnosis found. ? ?PERTINENT HISTORY: N/A ? ?PRECAUTIONS: none ? ?SUBJECTIVE: My neck pain is an 8/10 with cervical movements. I want to be able to return to being more active and working. I think I can tolerate working. I have more pain at rest or sitting. I tolerated the last PT session well. ?  ?PAIN:  ?Are you having pain? Yes ?NPRS scale: 8/10 c neck movements and 5/10 at rest. ?Pain location: posterior neck   ?PAIN TYPE: throbbing ?Pain description: constant  ?Aggravating factors: "waking up" ?Relieving factors: laying down  ?OBJECTIVE:  ?*Unless otherwise noted all  objective measures were captured on initial evaluation.  ?  ?DIAGNOSTIC FINDINGS:  ?  ?MRI Cervical 04/06/21 ?FINDINGS: ?Alignment: No significant listhesis. ?  ?Vertebrae: Vertebral body heights are maintained. No marrow edema. ?No suspicious osseous lesion. ?  ?Cord: No abnormal signal. ?  ?Posterior Fossa, vertebral arteries, paraspinal tissues: ?Unremarkable. ?  ?Disc levels: Small disc bulge at C4-C5. Mild left foraminal stenosis ?at this level. No canal stenosis at any level. ?  ?IMPRESSION: ?Mild degenerative disc disease at C4-C5.  No high-grade stenosis. ?  ?PATIENT SURVEYS:  ?FOTO 46% functional status; predicted 62%. Ospro 68.4 ?05/10/21: 48% function  ?  ?  ?COGNITION: ?Overall cognitive status: Within functional limits for tasks assessed ?           ?SENSATION: ?Light touch: Deficits decreased light touch pad of L thumb ?  ?POSTURE:  ?Slouched sitting position with forward head, rounded shoulders, increased kyphosis, decreased lordosis. Pt reports this is the position he is most comfortable in. Sitting with better posture increases the pt's L neck and shoulder pain. ?  ?PALPATION: ?TTP to the bilat upper trap and levator L>R. Pressure to L upper trap TP reduces the pt's L thumb numbness.   ?  ?CERVICAL AROM/PROM ?  ?A/PROM A/PROM (deg) ?04/22/2021 05/03/21  ?Flexion 60 increased  L cervical and L thumb tingling   ?Extension 50 ?increased  L cervical and L thumb tingling   ?Right lateral flexion 40 ?increased  R and L cervical and L thumb tingling 40  ?Left lateral flexion 30 ?increased  L cervical and L thumb tingling 35  ?Right rotation 70 ?increased  L cervical and L thumb tingling   ?Left rotation 50 ?increased  L cervical and L thumb tingling   ? (Blank rows = not tested) ?  ?UE AROM/PROM: ?  ?UE AROMs are WNLs and equal to each other ?  ?UE MMT: ?          Myotomal screen was negative c 5/5 strength demonstrated bilat  ?  ?CERVICAL SPECIAL TESTS:  ?Spurlings test was positive L. Distraction was positive  for decreasing L cervical pain. ?  ?TODAY'S TREATMENT:  ?[facility] Adult PT Treatment:                                                DATE: 06/03/21 ?Therapeutic Exercise: ?*** ?Manual Therapy: ?*** ?Neuromuscular re-ed: ?*** ?Therapeutic Activity: ?*** ?Modalities: ?*** ?Self Care: ?*** ? ? ?[facility] Adult PT Treatment:                                                DATE: 05/27/21 ?Therapeutic Exercise: ?UBE level 3 x 2 min each fwd/ bwd ?Upper trap stretch 2 x 30 sec bilateral  ?Levator scap stretch 2 x 30 sec bilateral  ?Resisted row 2 x 15 @ 75 lbs  ?Resisted horizontal abduction and ER 2 x 10 RTB ?Therapeutic Activity: ?Pushing weighted sled 60 lbs 2 x 50 ft x2 ?Pulling weighted sled 60 lbs 2 x 50 ft  x2 ?Lifting from floor to waist; 3 x 10; 25 lb kettlebell  ?Alt single arm waist to shoulder height lifting 8# 2x10  ? ? ?[facility] Adult PT Treatment:                                                DATE: 05/24/21 ?Therapeutic Exercise: ?UBE level 3 x 2 min each fwd/ bwd ?Upper trap stretch 2 x 30 sec bilateral  ?Levator scap stretch 2 x 30 sec bilateral  ?Resisted row 2 x 15 @ 65 lbs  ?Resisted horizontal abduction and diagonals 2 x 10 yellow band  ? ?Therapeutic Activity: ?Pushing weighted sled 60 lbs 2 x 50 ft ?Pulling weighted sled 60 lbs 2 x 50 ft  ?Lifting from floor to waist; 2 x 10; 25 lb kettlebell  ? ? ?[facility] Adult PT Treatment:                                                DATE: 05/10/21 ?Therapeutic Exercise: ?UBE level 2 x 2 min each fwd/bwd  ?Resisted shoulder extension red band 2 x 15  ?Resisted rows 2 x 15 blue band  ?Cervical retraction with orange ball in standing 2 x 10; 5 sec hold  ?Cervical retraction with rotation into orange ball 2 x 10  ?Pec doorway stretch 3 x 30 sec  ?Shoulder diagonals 2 x 10 yellow band  ?Prone scapular retraction 2 x 15  ?Prone T 2  x 15  ? ?PATIENT  EDUCATION:  ?Education details: n/a ?Person educated: n/a ?Education method: n/a ?Education comprehension: n/a ?  ?  ?HOME EXERCISE  PROGRAM: ?Access Code: [email or web address] ?URL: [email or web address] ?Date: 05/07/2021 ?Prepared by: Joellyn Rued ? ?Exercises ?Seated Upper Trapezius Stretch - 1 x daily - 7 x weekly - 1 sets - 3 reps - 20 hold ?Gentle Levator Scapulae Stretch - 1 x daily - 7 x weekly - 1 sets - 3 reps - 20 hold ?Standing Isometric Cervical Flexion with Manual Resistance - 2 x daily - 7 x weekly - 1 sets - 5 reps - 5 hold ?Standing Isometric Cervical Sidebending with Manual Resistance - 2 x daily - 7 x weekly - 1 sets - 5 reps - 5 hold ?Standing Isometric Cervical Extension with Manual Resistance - 2 x daily - 7 x weekly - 1 sets - 5 reps - 5 hold ?Seated Assisted Cervical Rotation with Towel - 1 x daily - 7 x weekly - 1 sets - 5 reps - 10 hold ?Supine DNF Liftoffs - 1 x daily - 7 x weekly - 1 sets - 5 reps - 5 hold ? ?  ?  ?ASSESSMENT: ?  ?CLINICAL IMPRESSION: ?PT was continued for functional strengthening/work specific activity. The demand for these activities was increased with today's PT session. Pt tolerated this increase without adverse effects at the time of service. Pt indicates his neck pain is worse primarily when he moves his neck. Pt will continue to benefit from skilled PT to optimize the his functional abilities. ?  ?OBJECTIVE IMPAIRMENTS decreased ROM, decreased strength, increased muscle spasms, impaired UE functional use, postural dysfunction, and pain.  ?  ?ACTIVITY LIMITATIONS cleaning, driving, meal prep, laundry, yard work, and shopping.  ?  ?PERSONAL FACTORS Fitness, Past/current experiences, and Time since onset of injury/illness/exacerbation are also affecting patient's functional outcome.  ?  ?GOALS: ?  ?SHORT TERM GOALS= LTGs ?  ?  ?LONG TERM GOALS:  ?  ?LTG Name Target Date Goal status  ?1 Pt will be Ind in a HEP to maintain achieved LOF. Ind with current HEP ?Baseline: 06/11/21 Ongoing  ?2 Pt will report a decrease in cervical pain to 4/10 or less for improved cervical and upper body function and  QOL ?Baseline:8-10/10 06/11/21 Ongoing  ?3 Pt's cervical ROM will increase: Ext to 60d, L and R lat flexion to 45d, L rotation 70d ?Baseline:See flow sheets 06/11/21 INITIAL  ?4 Pt's FOTO function score will increase

## 2021-06-03 ENCOUNTER — Ambulatory Visit: Payer: Self-pay

## 2021-06-09 ENCOUNTER — Ambulatory Visit: Payer: Self-pay

## 2021-06-09 DIAGNOSIS — R252 Cramp and spasm: Secondary | ICD-10-CM

## 2021-06-09 DIAGNOSIS — M542 Cervicalgia: Secondary | ICD-10-CM

## 2021-06-09 DIAGNOSIS — R293 Abnormal posture: Secondary | ICD-10-CM

## 2021-06-09 NOTE — Therapy (Signed)
?OUTPATIENT PHYSICAL THERAPY TREATMENT NOTE/ DISCHARGE SUMMARY ? ? ?Patient Name: Travis Burns ?MRN: [id number] ?DOB:1990/07/28, 31 y.o., male ?Today's Date: 06/09/2021 ? ?PCP: Marolyn Haller, MD ?REFERRING PROVIDER: Lenda Kelp, MD ? ? ? ? ? ? ? ? ? ?Past Medical History:  ?Diagnosis Date  ? ANHEDONIA 11/05/2009  ? Asthma   ? Broken arm   ? left  ? Bronchitis 01/31/2012  ? Dehydration 03/30/2012  ? Dermatitis 01/31/2012  ? Genital warts 06/29/2010  ? HIDRADENITIS SUPPURATIVA 03/23/2010  ? Other urethritis(597.89) 10/02/2009  ? Penile rash 04/2013  ? Dermatitis: nummular eczema vs Lichen planus (cutivate rx'd by derm)  ? Shoulder pain, right 03/30/2012  ? ?Past Surgical History:  ?Procedure Laterality Date  ? TREATMENT FISTULA ANAL    ? ?Patient Active Problem List  ? Diagnosis Date Noted  ? Cervical radiculopathy 03/29/2021  ? Trapezius muscle strain, left, subsequent encounter 03/12/2021  ? Tinea corporis 07/28/2013  ? Internal and external thrombosed hemorrhoids 11/22/2012  ? Pain of right scapula 10/08/2012  ? Penile rash 08/30/2012  ? Dehydration 03/30/2012  ? Shoulder pain, right 03/30/2012  ? Dermatitis 01/31/2012  ? Abdominal pain 10/31/2011  ? 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 Genital warts 06/29/2010  ? HIDRADENITIS SUPPURATIVA 03/23/2010  ? ANHEDONIA 11/05/2009  ? OTHER URETHRITIS 10/02/2009  ? ? ?REFERRING DIAG: M54.2 (ICD-10-CM) - Neck pain ? ?THERAPY DIAG:  ?No diagnosis found. ? ?PERTINENT HISTORY: N/A ? ?PRECAUTIONS: none ? ?SUBJECTIVE: Pt reports that he continues to have high level pain in his neck and says he is ready to be done with PT because it isn't working. ?  ?PAIN:  ?Are you having pain? Yes ?NPRS scale: 8/10 c neck movements and 5/10 at rest. ?Pain location: posterior neck   ?PAIN TYPE: throbbing ?Pain description: constant  ?Aggravating factors: "waking up" ?Relieving factors: laying down  ?OBJECTIVE:  ?*Unless otherwise noted all objective measures were captured on initial evaluation.  ?   ?DIAGNOSTIC FINDINGS:  ?  ?MRI Cervical 04/06/21 ?FINDINGS: ?Alignment: No significant listhesis. ?  ?Vertebrae: Vertebral body heights are maintained. No marrow edema. ?No suspicious osseous lesion. ?  ?Cord: No abnormal signal. ?  ?Posterior Fossa, vertebral arteries, paraspinal tissues: ?Unremarkable. ?  ?Disc levels: Small disc bulge at C4-C5. Mild left foraminal stenosis ?at this level. No canal stenosis at any level. ?  ?IMPRESSION: ?Mild degenerative disc disease at C4-C5.  No high-grade stenosis. ?  ?PATIENT SURVEYS:  ?FOTO 46% functional status; predicted 62%. Ospro 68.4 ?05/10/21: 48% function  ? 06/09/2021: 44% ?  ?COGNITION: ?Overall cognitive status: Within functional limits for tasks assessed ?           ?SENSATION: ?Light touch: Deficits decreased light touch pad of L thumb ?  ?POSTURE:  ?Slouched sitting position with forward head, rounded shoulders, increased kyphosis, decreased lordosis. Pt reports this is the position he is most comfortable in. Sitting with better posture increases the pt's L neck and shoulder pain. ?  ?PALPATION: ?TTP to the bilat upper trap and levator L>R. Pressure to L upper trap TP reduces the pt's L thumb numbness.   ?  ?CERVICAL AROM/PROM ?  ?A/PROM A/PROM (deg) ?04/22/2021 05/03/21 06/09/2021 ?AROM  ?Flexion 60 increased  L cervical and L thumb tingling  35p!  ?Extension 50 ?increased  L cervical and L thumb tingling  40P!  ?Right lateral flexion 40 ?increased  R and L cervical and L thumb tingling 40 30  ?Left lateral flexion 30 ?increased  L cervical and L thumb tingling 35  30p!  ?Right rotation 70 ?increased  L cervical and L thumb tingling  60p!  ?Left rotation 50 ?increased  L cervical and L thumb tingling  63p!  ? (Blank rows = not tested) ?  ?UE AROM/PROM: ?  ?UE AROMs are WNLs and equal to each other ?  ?UE MMT: ?          Myotomal screen was negative c 5/5 strength demonstrated bilat  ?  ?CERVICAL SPECIAL TESTS:  ?Spurlings test was positive L. Distraction was positive  for decreasing L cervical pain. ?  ?TODAY'S TREATMENT:  ? ?[facility] Adult PT Treatment:                                                DATE: 06/09/2021 ?Therapeutic Exercise: ?Standing low rows with chin tuck hold and GTB 3x10 ?Standing shoulder extension with chin tuck hold and GTB 3x10 ?Low trap lift-off at wall 2x10 ?Standing median nerve glides on Lt 2x20 ?Manual Therapy: ?Prone CT junction grade V manipulation x1 BIL with cavitation ?Standing J scoop upper thoracic manipulation x1 with cavitation ?Neuromuscular re-ed: ?N/A ?Therapeutic Activity: ?Re-assessment of objective measures with pt education ?FOTO re-administration with pt education ?Modalities: ?N/A ?Self Care: ?N/A ? ? ?[facility] Adult PT Treatment:                                                DATE: 05/27/21 ?Therapeutic Exercise: ?UBE level 3 x 2 min each fwd/ bwd ?Upper trap stretch 2 x 30 sec bilateral  ?Levator scap stretch 2 x 30 sec bilateral  ?Resisted row 2 x 15 @ 75 lbs  ?Resisted horizontal abduction and ER 2 x 10 RTB ?Therapeutic Activity: ?Pushing weighted sled 60 lbs 2 x 50 ft x2 ?Pulling weighted sled 60 lbs 2 x 50 ft  x2 ?Lifting from floor to waist; 3 x 10; 25 lb kettlebell  ?Alt single arm waist to shoulder height lifting 8# 2x10  ? ? ?[facility]                                                DATE: 05/24/21 ?Therapeutic Exercise: ?UBE level 3 x 2 min each fwd/ bwd ?Upper trap stretch 2 x 30 sec bilateral  ?Levator scap stretch 2 x 30 sec bilateral  ?Resisted row 2 x 15 @ 65 lbs  ?Resisted horizontal abduction and diagonals 2 x 10 yellow band  ? ?Therapeutic Activity: ?Pushing weighted sled 60 lbs 2 x 50 ft ?Pulling weighted sled 60 lbs 2 x 50 ft  ?Lifting from floor to waist; 2 x 10; 25 lb kettlebell  ? ? ? ?PATIENT EDUCATION:  ?Education details: n/a ?Person educated: n/a ?Education method: n/a ?Education comprehension: n/a ?  ?  ?HOME EXERCISE PROGRAM: ?Access Code: [email or web address] ?URL: [email or web address] ?Date:  05/07/2021 ?Prepared by: Joellyn RuedAllen Ralls ? ?Exercises ?Seated Upper Trapezius Stretch - 1 x daily - 7 x weekly - 1 sets - 3 reps - 20 hold ?Gentle Levator Scapulae Stretch - 1 x daily - 7 x weekly - 1 sets - 3 reps - 20 hold ?Standing  Isometric Cervical Flexion with Manual Resistance - 2 x daily - 7 x weekly - 1 sets - 5 reps - 5 hold ?Standing Isometric Cervical Sidebending with Manual Resistance - 2 x daily - 7 x weekly - 1 sets - 5 reps - 5 hold ?Standing Isometric Cervical Extension with Manual Resistance - 2 x daily - 7 x weekly - 1 sets - 5 reps - 5 hold ?Seated Assisted Cervical Rotation with Towel - 1 x daily - 7 x weekly - 1 sets - 5 reps - 10 hold ?Supine DNF Liftoffs - 1 x daily - 7 x weekly - 1 sets - 5 reps - 5 hold ? ?Added 06/09/2021:  ?- Standing Shoulder Row with Anchored Resistance  - 1 x daily - 7 x weekly - 3 sets - 10 reps - 3-sec hold ?- Low trap slides at wall with lift-off  - 1 x daily - 7 x weekly - 3 sets - 10 reps - 3-sec hold ?- Single Arm Shoulder Extension with Anchored Resistance  - 1 x daily - 7 x weekly - 3 sets - 10 reps - 3-sec hold ?- Median Nerve Flossing - Tray  - 1 x daily - 7 x weekly - 3 sets - 20 reps ? ?  ?  ?ASSESSMENT: ?  ?CLINICAL IMPRESSION: ?Pt continues to report high levels of baseline pain and wishes to be discharged from PT due to a lack of progress. His HEP was updated to include parascapular strengthening exercises and median nerve glides to continue to independently address his primary impairments. The pt responded well to OMT today, demonstrating improved passive accessory mobility following treatment, although he reports no change in his baseline symptoms. Due to a lack of progress made in PT, the pt is discharged from PT at this time. ?  ?OBJECTIVE IMPAIRMENTS decreased ROM, decreased strength, increased muscle spasms, impaired UE functional use, postural dysfunction, and pain.  ?  ?ACTIVITY LIMITATIONS cleaning, driving, meal prep, laundry, yard work, and  shopping.  ?  ?PERSONAL FACTORS Fitness, Past/current experiences, and Time since onset of injury/illness/exacerbation are also affecting patient's functional outcome.  ?  ?GOALS: ?  ?SHORT TERM GOALS= LTGs ?  ?  ?LONG TERM

## 2021-06-16 ENCOUNTER — Ambulatory Visit (INDEPENDENT_AMBULATORY_CARE_PROVIDER_SITE_OTHER): Payer: Self-pay | Admitting: Internal Medicine

## 2021-06-16 VITALS — BP 133/82 | HR 88 | Wt 183.7 lb

## 2021-06-16 DIAGNOSIS — Z5989 Other problems related to housing and economic circumstances: Secondary | ICD-10-CM

## 2021-06-16 DIAGNOSIS — M5412 Radiculopathy, cervical region: Secondary | ICD-10-CM

## 2021-06-16 MED ORDER — LIDOCAINE 5 % EX PTCH: 1.0000 | MEDICATED_PATCH | Freq: Two times a day (BID) | CUTANEOUS | 0 refills | Status: AC

## 2021-06-16 MED ORDER — NAPROXEN 500 MG PO TABS: 500.0000 mg | ORAL_TABLET | Freq: Two times a day (BID) | ORAL | 0 refills | Status: AC
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
## 2021-06-16 NOTE — Patient Instructions (Signed)
Thank you, Mr.Amram Falco for allowing [place] to provide your care today. Today we discussed: ? ?Neck/shoulder pain: You can take naproxen 500 mg twice daily for your pain. You can also continue to use the massage device you have and start using lidocaine patches. Ask your lawyer about seeing a disability doctor. I have provided you with a letter stating that you should be out of work until you see this doctor. ? ?I have ordered the following labs for you: ? ?Lab Orders  ?No laboratory test(s) ordered today  ?  ? ? ?I have ordered the following medication/changed the following medications:  ? ?Stop the following medications: ?There are no discontinued medications.  ? ?Start the following medications: ?Meds ordered this encounter  ?Medications  ? naproxen (NAPROSYN) 500 MG tablet  ?  Sig: Take 1 tablet (500 mg total) by mouth 2 (two) times daily with a meal for 14 days.  ?  Dispense:  28 tablet  ?  Refill:  0  ? lidocaine (LIDODERM) 5 %  ?  Sig: Place 1 patch onto the skin every 12 (twelve) hours. Remove & Discard patch within 12 hours or as directed by MD  ?  Dispense:  10 patch  ?  Refill:  0  ?  ? ?Follow up: 6 months  ? ? ?Should you have any questions or concerns please call the internal medicine clinic at [phone].   ? ? ?Elza Rafter, D.O. ?[facility] ? ? ?
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
## 2021-06-17 ENCOUNTER — Telehealth: Payer: Self-pay

## 2021-06-17 NOTE — Telephone Encounter (Signed)
? ?  Telephone encounter was:  Unsuccessful.  06/17/2021 ?Name: Travis Burns MRN: [id number] DOB: 1990-06-04 ? ?Unsuccessful outbound call made today to assist with:   Health Insurance ? ?Outreach Attempt:  1st Attempt ? ?A HIPAA compliant voice message was left requesting a return call.  Instructed patient to call back at [phone] at their earliest convenience . ? ?Hessie Knows ?Care Guide, Embedded Care Coordination ?[facility]  Care management  ?[place], [place] [address]  ?Main Phone: [phone]  E-mail: [email or web address]  ?Website: [email or web address] ? ? ? ?

## 2021-06-18 ENCOUNTER — Telehealth: Payer: Self-pay

## 2021-06-18 NOTE — Progress Notes (Signed)
Internal Medicine Clinic Attending ° °Case discussed with Dr. Atway  At the time of the visit.  We reviewed the resident’s history and exam and pertinent patient test results.  I agree with the assessment, diagnosis, and plan of care documented in the resident’s note.  °

## 2021-06-18 NOTE — Telephone Encounter (Signed)
? ?  Telephone encounter was:  Unsuccessful.  06/18/2021 ?Name: Travis Burns MRN: [id number] DOB: Mar 15, 1990 ? ?Unsuccessful outbound call made today to assist with:   Health Insurance  ? ?Outreach Attempt:  2nd Attempt ? ?A HIPAA compliant voice message was left requesting a return call.  Instructed patient to call back at [phone] at their earliest convenience. ? ?Hessie Knows ?Care Guide, Embedded Care Coordination ?[facility]  Care management  ?[place], [place] [address]  ?Main Phone: [phone]  E-mail: [email or web address]  ?Website: [email or web address] ? ? ? ?

## 2021-06-21 ENCOUNTER — Telehealth: Payer: Self-pay

## 2021-06-21 NOTE — Telephone Encounter (Signed)
? ?  Telephone encounter was:  Unsuccessful.  06/21/2021 ?Name: Travis Burns MRN: [id number] DOB: 04-24-1990 ? ?Unsuccessful outbound call made today to assist with:   Health Insurance ? ?Outreach Attempt:  3rd Attempt.  Referral closed unable to contact patient. ? ?A HIPAA compliant voice message was left requesting a return call.  Instructed patient to call back at [phone] at their earliest convenience. ? ?CG has tried to contact pt 3 times and have also been unsuccessful contacting Travis Burns, Travis Burns in regard in trying to reach pt to assist with health insurance. CG will advise provider/clinic of the unsuccessful attempts. ? ?Travis Burns ?Care Guide, Embedded Care Coordination ?[facility]  Care management  ?[place], [place] [address]  ?Main Phone: [phone]  E-mail: [email or web address]  ?Website: [email or web address] ? ?. ? ? ?

## 2022-03-02 IMAGING — MR MR CERVICAL SPINE W/O CM
5 series · 29 of 48 positions shown · non-contrast
Comparison: None.

CLINICAL DATA: Cervical radiculopathy, no red flags

EXAM:
MRI CERVICAL SPINE WITHOUT CONTRAST
TECHNIQUE: Multiplanar, multisequence MR imaging of the cervical spine was
performed. No intravenous contrast was administered.

[Series 2: T2 · sagittal · 3.0mm · 0.82mm/px · 6 of 18 slices shown (1 of 2)]
[im 1/18]
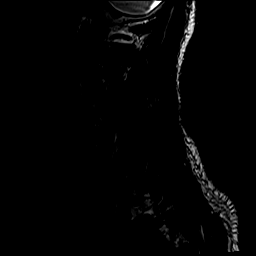
[im 4/18]
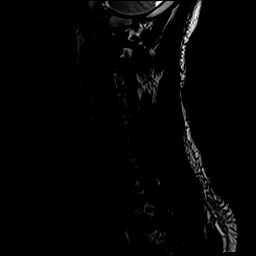
[im 7/18]
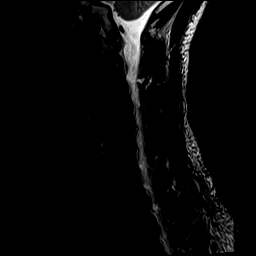
[im 11/18]
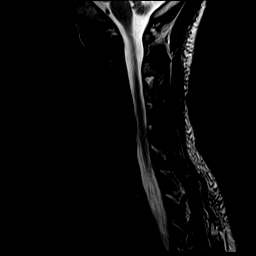
[im 14/18]
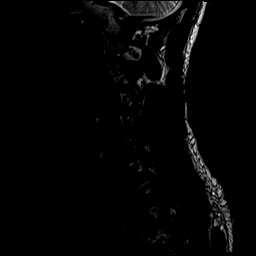
[im 18/18]
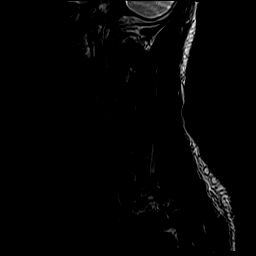

[Series 3: T1 · sagittal · 3.0mm · 0.41mm/px · 6 of 18 slices shown]
[im 1/18]
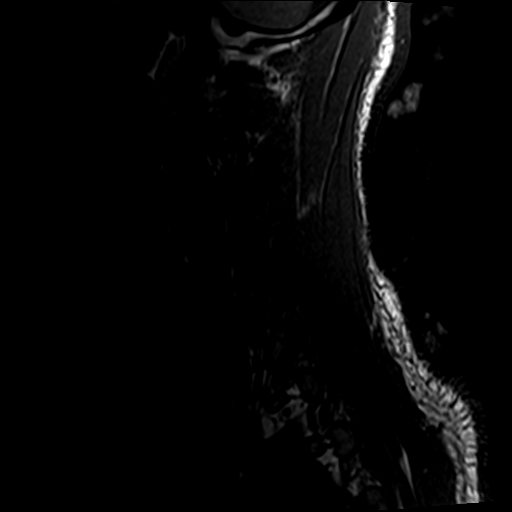
[im 4/18]
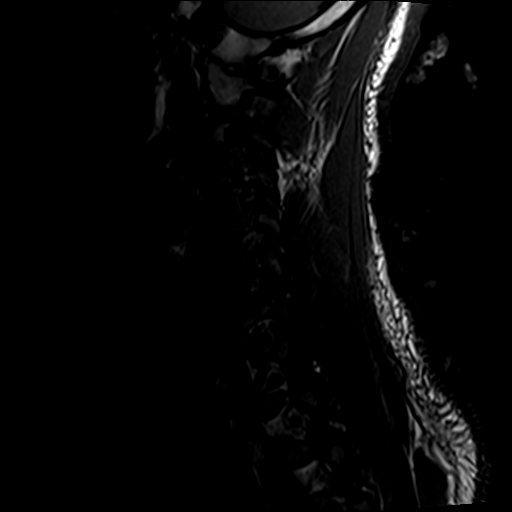
[im 7/18]
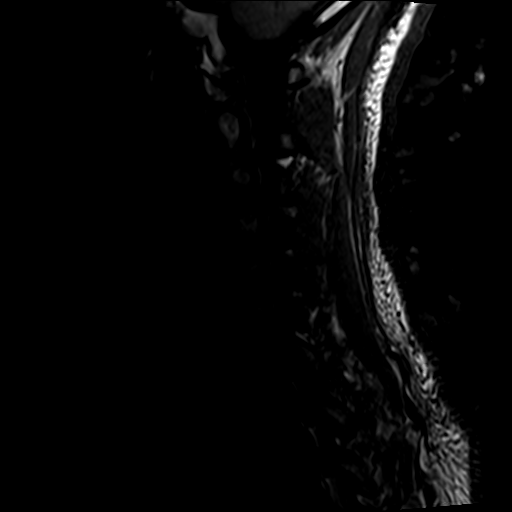
[im 11/18]
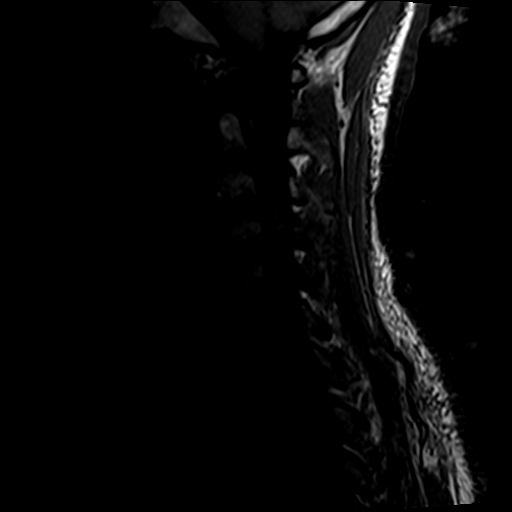
[im 14/18]
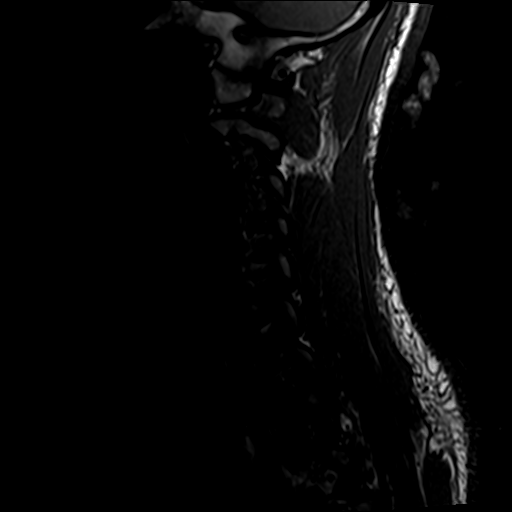
[im 18/18]
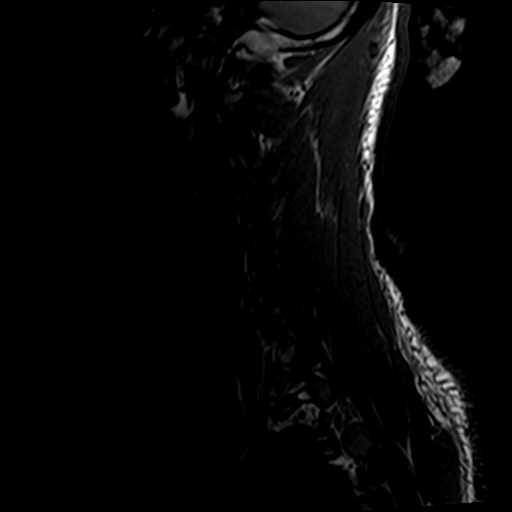

[Series 4: tir sag · sagittal · 3.0mm · 0.41mm/px · 7 of 18 slices shown]
[im 1/18]
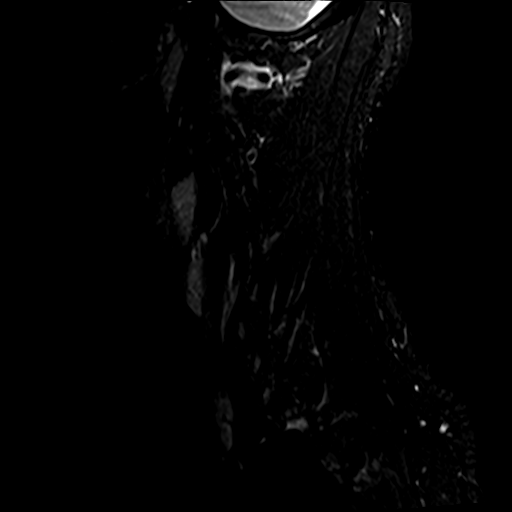
[im 3/18]
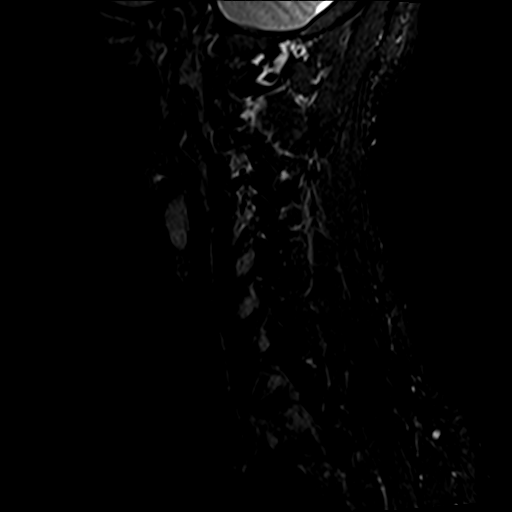
[im 6/18]
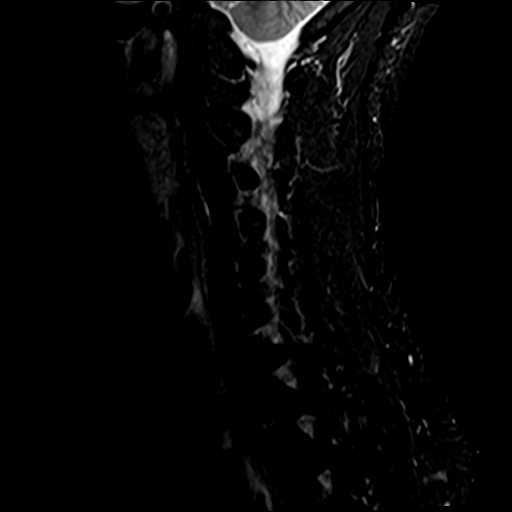
[im 9/18]
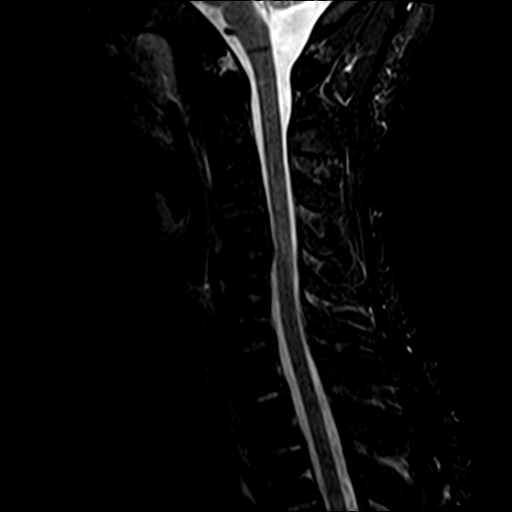
[im 12/18]
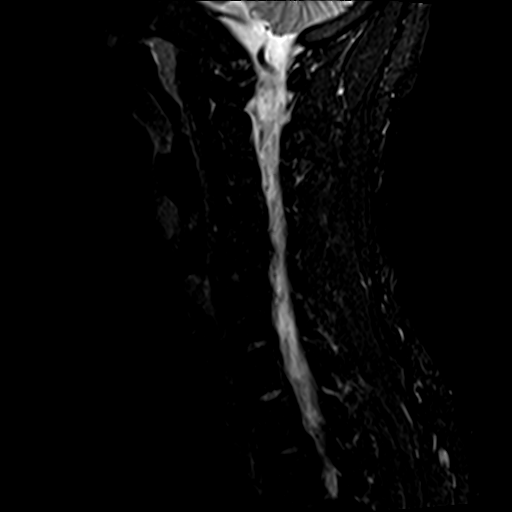
[im 15/18]
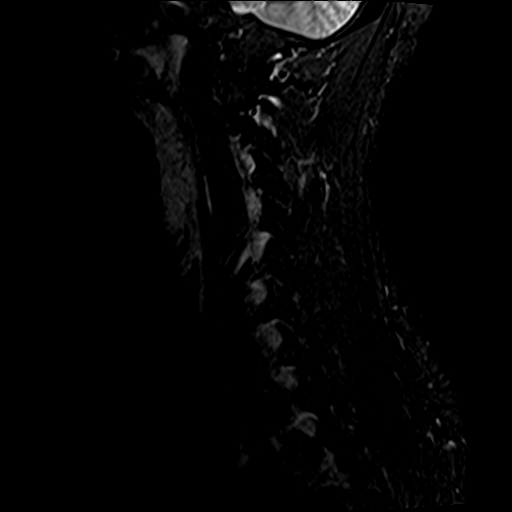
[im 18/18]
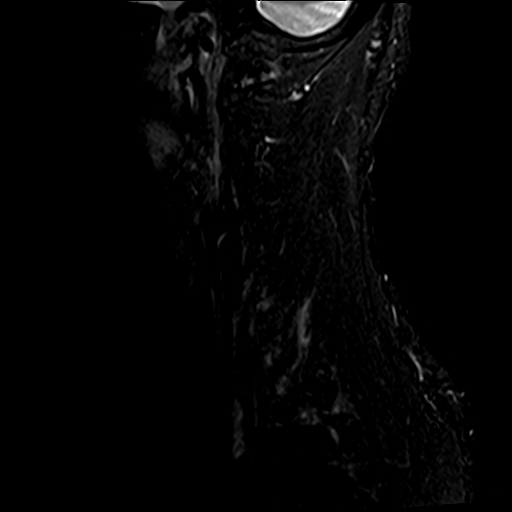

[Series 5: GRE · axial · 3.0mm · 0.39mm/px · z∈[-78,-59]mm · 2 of 40 slices shown]
[im 1/40]
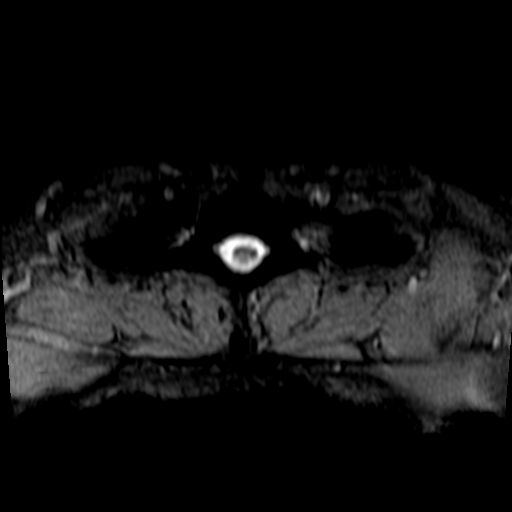
[im 6/40]
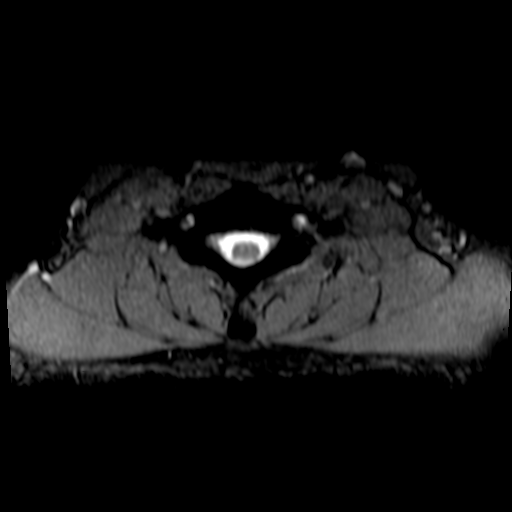

[Series 6: T2 · axial · 3.0mm · 0.78mm/px · z∈[-78,+67]mm · 8 of 37 slices shown (2 of 2)]
[im 1/37]
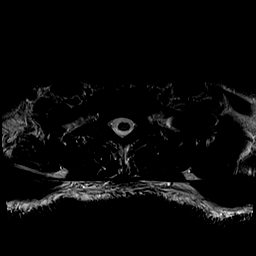
[im 6/37]
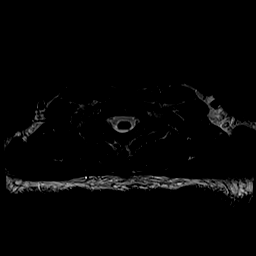
[im 12/37]
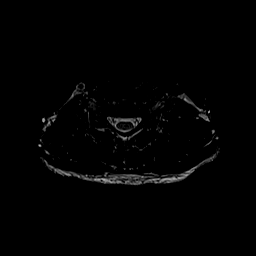
[im 17/37]
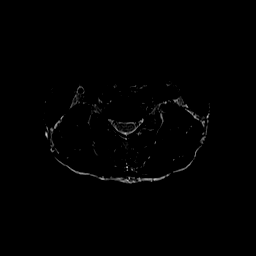
[im 20/37]
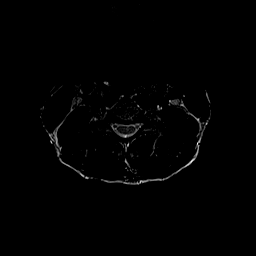
[im 25/37]
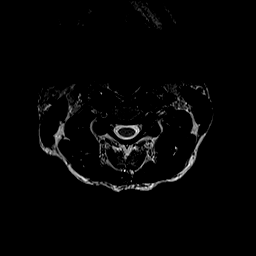
[im 31/37]
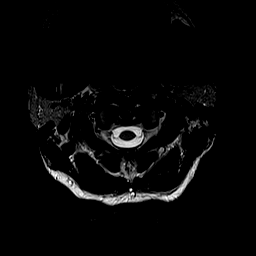
[im 37/37]
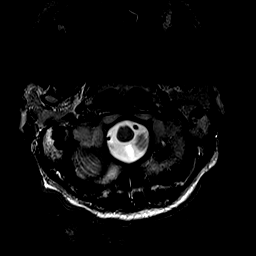

[29 of 48 positions shown; findings below may reference images not displayed]

FINDINGS: Alignment: No significant listhesis.

Vertebrae: Vertebral body heights are maintained. No marrow edema.
No suspicious osseous lesion.

Cord: No abnormal signal.

Posterior Fossa, vertebral arteries, paraspinal tissues:
Unremarkable.

Disc levels: Small disc bulge at C4-C5. Mild left foraminal stenosis
at this level. No canal stenosis at any level.
IMPRESSION: Mild degenerative disc disease at C4-C5.  No high-grade stenosis.

## 2022-08-01 ENCOUNTER — Encounter: Payer: Self-pay | Admitting: *Deleted

## 2023-06-11 ENCOUNTER — Emergency Department (HOSPITAL_COMMUNITY): Payer: Self-pay

## 2023-06-11 ENCOUNTER — Other Ambulatory Visit: Payer: Self-pay

## 2023-06-11 ENCOUNTER — Encounter (HOSPITAL_COMMUNITY): Payer: Self-pay | Admitting: Emergency Medicine

## 2023-06-11 ENCOUNTER — Emergency Department (HOSPITAL_COMMUNITY)
Admission: EM | Admit: 2023-06-11 | Discharge: 2023-06-11 | Disposition: A | Discharge status: Home / Self Care | Payer: Self-pay | Attending: Emergency Medicine | Admitting: Emergency Medicine

## 2023-06-11 DIAGNOSIS — M25561 Pain in right knee: Secondary | ICD-10-CM

## 2023-06-11 NOTE — ED Provider Notes (Signed)
 [place] EMERGENCY DEPARTMENT AT [facility] Provider Note   CSN: [id number] Arrival date & time: 06/11/23  1701     History  Chief Complaint  Patient presents with   Knee Pain    Travis Burns is a 33 y.o. male.  The history is provided by the patient and medical records.  Knee Pain  33 year old male presenting to the ED with right knee pain for the past week.  States he was jogging down a hill when he felt a "pop" in his right knee.  Since that time he has had ongoing pain in the right knee, worse with trying to squat down or making pivoting motions.  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  Has been taking Tylenol but does not have much relief.  Home Medications Prior to Admission medications   Medication Sig Start Date End Date Taking? Authorizing Provider  fluticasone (FLONASE) 50 MCG/ACT nasal spray Place 2 sprays into both nostrils daily. 04/22/20   Sharla Davis, PA-C  levocetirizine (XYZAL) 5 MG tablet Take 1 tablet (5 mg total) by mouth every evening. 03/04/20   Lowne Chase, Yvonne R, DO  meloxicam (MOBIC) 7.5 MG tablet Take 1 tablet (7.5 mg total) by mouth daily. 11/10/20   Erlinda Haws, NP  methocarbamol (ROBAXIN) 500 MG tablet Take 4 tablets (2,000 mg total) by mouth 3 (three) times daily for 7 days. 03/11/21 03/18/21  Joette Mustard, MD      Allergies    Patient has no known allergies.    Review of Systems   Review of Systems  Musculoskeletal:  Positive for arthralgias.  All other systems reviewed and are negative.   Physical Exam Updated Vital Signs BP (!) 150/102   Pulse 77   Temp 98.4 F (36.9 C) (Oral)   Resp 18   Ht 6\' 5"  (1.956 m)   Wt 99.8 kg   SpO2 100%   BMI 26.09 kg/m  Physical Exam Vitals and nursing note reviewed.  Constitutional:      Appearance: He is well-developed.  HENT:     Head: Normocephalic and atraumatic.  Eyes:     Conjunctiva/sclera: Conjunctivae normal.     Pupils: Pupils  are equal, round, and reactive to light.  Cardiovascular:     Rate and Rhythm: Normal rate and regular rhythm.     Heart sounds: Normal heart sounds.  Pulmonary:     Effort: Pulmonary effort is normal.     Breath sounds: Normal breath sounds.  Abdominal:     General: Bowel sounds are normal.     Palpations: Abdomen is soft.  Musculoskeletal:        General: Normal range of motion.     Cervical back: Normal range of motion.     Comments: Right knee is without any swelling or overlying skin abnormality, there is no significant effusion, he is able to ambulate and squat down during exam, does have some grinding when standing back up, I do not appreciate any significant ligamentous laxity, leg is neurovascularly intact.  Skin:    General: Skin is warm and dry.  Neurological:     Mental Status: He is alert and oriented to person, place, and time.     ED Results / Procedures / Treatments   Labs (all labs ordered are listed, but only abnormal results are displayed) Labs Reviewed - No data to display  EKG None  Radiology No results found.  CLINICAL DATA: Knee pain  EXAM: RIGHT KNEE - COMPLETE 4+ VIEW  COMPARISON: None Available.  FINDINGS: No evidence of fracture, dislocation, or joint effusion. There is lucent somewhat mottled appearance of the distal femoral diaphysis and proximal tibial diaphysis without discrete margins. Soft tissues are unremarkable.  IMPRESSION: 1. No acute fracture or dislocation. 2. Lucent somewhat mottled appearance of the distal femoral diaphysis and proximal tibial diaphysis without discrete margins. This is nonspecific and may be related to demineralization. Consider further evaluation with MRI.   Electronically Signed By: Tyron Gallon M.D. On: 06/11/2023 18:19   Procedures Procedures    Medications Ordered in ED Medications - No data to display  ED Course/ Medical Decision Making/ A&P                                 Medical  Decision Making Amount and/or Complexity of Data Reviewed Radiology: ordered and independent interpretation performed.   33 year old male here with right knee pain.  Ongoing for a week since he felt a pop while jogging down a hill.  He remains ambulatory here, has essentially normal range of motion.  Does have some popping and clicking sensations when squatting down and standing back up.  He does not have any significant ligamentous laxity.    X-ray with some questionable bony demineralization, MRI for further evaluation.  MRIs not available at this hour at this facility, do not feel this needs to me done emergently though.  He has been placed into a knee brace and will be referred to orthopedics for likely outpatient MRI.  He may have some type of internal derangement as well which I discussed with him.  Can continue Tylenol and Motrin as needed for pain.  Can return here for new concerns.  Final Clinical Impression(s) / ED Diagnoses Final diagnoses:  Acute pain of right knee    Rx / DC Orders ED Discharge Orders     None         Coretha Dew, PA-C 06/11/23 2307    Hershel Los, MD 06/11/23 2318

## 2023-06-11 NOTE — ED Triage Notes (Signed)
 Patient comes in with right knee for over 1 week. He did feel a pop going down a hill last week. He does take tylenol but it isn't helping

## 2023-06-11 NOTE — Discharge Instructions (Signed)
 Can wear knee sleeve for now. Continue tylenol or motrin as needed for pain. Recommend to follow-up with orthopedics-- they may want to get MRI of your knee. Return here for new concerns.

## 2023-06-14 ENCOUNTER — Encounter: Payer: Self-pay | Admitting: Physician Assistant

## 2023-06-14 ENCOUNTER — Ambulatory Visit (INDEPENDENT_AMBULATORY_CARE_PROVIDER_SITE_OTHER): Payer: Self-pay | Admitting: Physician Assistant

## 2023-06-14 DIAGNOSIS — M25561 Pain in right knee: Secondary | ICD-10-CM

## 2023-06-14 NOTE — Progress Notes (Signed)
 Office Visit Note   Patient: Travis Burns           Date of Birth: 03/30/1990           MRN: [id number] Visit Date: 06/14/2023              Requested by: Manuela Neptune, MD [address] [place],  [place] [address] PCP: Manuela Neptune, MD   Assessment & Plan: Visit Diagnoses:  1. 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  He was walking down a steep hill when he felt a sudden pop and pain on the lateral side of his right knee.  He said it did get swelling this was about 2 weeks ago.  His exam today is fairly benign but he does have a little bit of swelling.  He feels like something is just not right and he does not trust this knee.  Would like to get an MRI we will call him with the results once this is completed  Follow-Up Instructions: Will contact patient with MRI results  Orders:  No orders of the defined types were placed in this encounter.  No orders of the defined types were placed in this encounter.     Procedures: No procedures performed   Clinical Data: No additional findings.   Subjective: No chief complaint on file.   HPI 33 year old gentleman chief complaint of right knee pain and popping.  This began 2 weeks ago when he was walking down a steep hill on some gravel.  He felt a large pop began having pain in the lateral and posterior lateral aspect of his knee.  Was seen in the emergency room x-rays did not demonstrate any acute fracture.  He feels the knee is of anything is gotten worse he cannot squat  Review of Systems   Objective: Vital Signs: There were no vitals taken for this visit.  Physical Exam Right knee is neurovascularly intact mild soft tissue swelling no effusion good endpoint on anterior draw some pain over the lateral joint line could not really reproduce McMurray's.  Some pain with terminal extension and with squatting.  Compartments are soft and nontender  otherwise neurovascular intact Ortho Exam  Specialty Comments:  No specialty comments available.  Imaging: No results found.   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   Genital warts 06/29/2010   HIDRADENITIS SUPPURATIVA 03/23/2010   ANHEDONIA 11/05/2009   OTHER URETHRITIS 10/02/2009   Past Medical History:  Diagnosis Date   ANHEDONIA 11/05/2009   Asthma    Broken arm    left   Bronchitis 01/31/2012   Dehydration 03/30/2012   Dermatitis 01/31/2012   Genital warts 06/29/2010   HIDRADENITIS SUPPURATIVA 03/23/2010   Other urethritis(597.89) 10/02/2009   Penile rash 04/2013   Dermatitis: nummular eczema vs Lichen planus (cutivate rx'd by derm)   Shoulder pain, right 03/30/2012    Family History  Problem Relation Age of Onset   Cancer Maternal Grandfather  lung/ Smoker   Alcohol abuse Paternal Grandmother     Past Surgical History:  Procedure Laterality Date   TREATMENT FISTULA ANAL     Social History   Occupational History   Not on file  Tobacco Use   Smoking status: Former    Current packs/day: 0.00    Types: Cigarettes    Quit date: 08/25/2009    Years since quitting: 13.8   Smokeless tobacco: Never  Substance and Sexual Activity   Alcohol use: Yes    Alcohol/week: 0.0 standard drinks of alcohol    Comment: occasional   Drug use: No    Types: Marijuana    Comment: marijuana   Sexual activity: Not Currently    Partners: Female

## 2023-06-29 ENCOUNTER — Telehealth: Payer: Self-pay

## 2023-06-29 ENCOUNTER — Telehealth: Payer: Self-pay | Admitting: Physician Assistant

## 2023-06-29 NOTE — Telephone Encounter (Signed)
 Pt request a call [place] over his mri results Pt's call back number [phone]

## 2023-07-12 ENCOUNTER — Ambulatory Visit (HOSPITAL_BASED_OUTPATIENT_CLINIC_OR_DEPARTMENT_OTHER): Payer: Self-pay | Admitting: Orthopaedic Surgery

## 2023-07-12 DIAGNOSIS — M25561 Pain in right knee: Secondary | ICD-10-CM

## 2023-07-12 MED ORDER — LIDOCAINE HCL 1 % IJ SOLN
4.0000 mL | INTRAMUSCULAR | Status: AC | PRN
  Administered 2023-07-12: 4 mL

## 2023-07-12 MED ORDER — TRIAMCINOLONE ACETONIDE 40 MG/ML IJ SUSP
80.0000 mg | INTRAMUSCULAR | Status: AC | PRN
  Administered 2023-07-12: 80 mg via INTRA_ARTICULAR

## 2023-07-12 NOTE — Progress Notes (Signed)
 Chief Complaint: Right knee pain     History of Present Illness:    Travis Burns is a 33 y.o. male presents with right lateral knee pain after an injury where he was going down a hill while at his job at [employer].  He felt a pop.  Since this time he has had lateral knee pain.  He has been seen by Benancio Bracket persons.  He has been placed on anti-inflammatories without significant relief.  He continues to experience pain and popping laterally    PMH/PSH/Family History/Social History/Meds/Allergies:    Past Medical History:  Diagnosis Date  . ANHEDONIA 11/05/2009  . Asthma   . Broken arm    left  . Bronchitis 01/31/2012  . Dehydration 03/30/2012  . Dermatitis 01/31/2012  . Genital warts 06/29/2010  . HIDRADENITIS SUPPURATIVA 03/23/2010  . Other urethritis(597.89) 10/02/2009  . Penile rash 04/2013   Dermatitis: nummular eczema vs Lichen planus (cutivate  rx'd by derm)  . Shoulder pain, right 03/30/2012   Past Surgical History:  Procedure Laterality Date  . TREATMENT FISTULA ANAL     Social History   Socioeconomic History  . Marital status: Single    Spouse name: Not on file  . Number of children: Not on file  . Years of education: Not on file  . Highest education level: Not on file  Occupational History  . Not on file  Tobacco Use  . Smoking status: Former    Current packs/day: 0.00    Types: Cigarettes    Quit date: 08/25/2009    Years since quitting: 13.8  . Smokeless tobacco: Never  Substance and Sexual Activity  . Alcohol use: Yes    Alcohol/week: 0.0 standard drinks of alcohol    Comment: occasional  . Drug use: No    Types: Marijuana    Comment: marijuana  . Sexual activity: Not Currently    Partners: Female  Other Topics Concern  . Not on file  Social History Narrative  . Not on file   Social Drivers of Health   Financial Resource Strain: Not on file  Food Insecurity: Not on file  Transportation Needs: Not on file  Physical Activity: Not on file  Stress: Not  on file  Social Connections: Not on file   Family History  Problem Relation Age of Onset  . Cancer Maternal Grandfather        lung/ Smoker  . Alcohol abuse Paternal Grandmother    No Known Allergies Current Outpatient Medications  Medication Sig Dispense Refill  . fluticasone  (FLONASE ) 50 MCG/ACT nasal spray Place 2 sprays into both nostrils daily. 16 mL 0  . levocetirizine (XYZAL ) 5 MG tablet Take 1 tablet (5 mg total) by mouth every evening. 30 tablet 5  . meloxicam  (MOBIC ) 7.5 MG tablet Take 1 tablet (7.5 mg total) by mouth daily. 30 tablet 0  . methocarbamol  (ROBAXIN ) 500 MG tablet Take 4 tablets (2,000 mg total) by mouth 3 (three) times daily for 7 days. 84 tablet 0   No current facility-administered medications for this visit.   No results found.  Review of Systems:   A ROS was performed including pertinent positives and negatives as documented in the HPI.  Physical Exam :   Constitutional: NAD and appears stated age Neurological: Alert and oriented Psych: Appropriate affect and cooperative There were no vitals taken for this visit.   Comprehensive Musculoskeletal Exam:    Tenderness about the lateral joint line with positive McMurray, negative Lachman, negative posterior drawer, no  laxity with varus or valgus stress.  Distal neurosensory exam intact   Imaging:   Xray (4 views right knee): Normal  MRI (right knee): Discoid meniscus laterally with tearing   I personally reviewed and interpreted the radiographs.   Assessment and Plan:   33 y.o. male with evidence of a symptomatic lateral discoid meniscus.  At today's visit I did discuss initial ultrasound-guided injection of the right knee.  He would like to proceed with this.  I will plan to see him back should this not give him complete relief  -Right knee ultrasound-guided injection provided verbal consent obtained    Procedure Note  Patient: Travis Burns             Date of Birth: 04-22-1990            MRN: [id number]             Visit Date: 07/12/2023  Procedures: Visit Diagnoses: No diagnosis found.  Large Joint Inj: R knee on 07/12/2023 5:41 PM Indications: pain Details: 22 G 1.5 in needle, ultrasound-guided anterior approach  Arthrogram: No  Medications: 4 mL lidocaine  1 %; 80 mg triamcinolone acetonide 40 MG/ML Outcome: tolerated well, no immediate complications Procedure, treatment alternatives, risks and benefits explained, specific risks discussed. Consent was given by the patient. Immediately prior to procedure a time out was called to verify the correct patient, procedure, equipment, support staff and site/side marked as required. Patient was prepped and draped in the usual sterile fashion.        I personally saw and evaluated the patient, and participated in the management and treatment plan.  Wilhelmenia Harada, MD Attending Physician, Orthopedic Surgery  This document was dictated using Dragon voice recognition software. A reasonable attempt at proof reading has been made to minimize errors.

## 2024-01-01 ENCOUNTER — Encounter: Payer: Self-pay | Admitting: Radiology
